# Patient Record
Sex: Female | Born: 1940 | Race: White | Hispanic: No | Marital: Married | Smoking: Never smoker
Health system: Southern US, Community
[De-identification: ages and names within clinical notes are randomized; demographics above are authoritative.]

## PROBLEM LIST (undated history)

## (undated) DIAGNOSIS — I493 Ventricular premature depolarization: Secondary | ICD-10-CM

## (undated) DIAGNOSIS — R51 Headache: Secondary | ICD-10-CM

## (undated) DIAGNOSIS — E785 Hyperlipidemia, unspecified: Secondary | ICD-10-CM

## (undated) DIAGNOSIS — I77810 Thoracic aortic ectasia: Secondary | ICD-10-CM

## (undated) DIAGNOSIS — Z973 Presence of spectacles and contact lenses: Secondary | ICD-10-CM

## (undated) DIAGNOSIS — M7989 Other specified soft tissue disorders: Secondary | ICD-10-CM

## (undated) DIAGNOSIS — I2510 Atherosclerotic heart disease of native coronary artery without angina pectoris: Secondary | ICD-10-CM

## (undated) DIAGNOSIS — Z974 Presence of external hearing-aid: Secondary | ICD-10-CM

## (undated) DIAGNOSIS — R519 Headache, unspecified: Secondary | ICD-10-CM

## (undated) DIAGNOSIS — Z9689 Presence of other specified functional implants: Secondary | ICD-10-CM

## (undated) DIAGNOSIS — M1990 Unspecified osteoarthritis, unspecified site: Secondary | ICD-10-CM

## (undated) HISTORY — DX: Headache, unspecified: R51.9

## (undated) HISTORY — PX: INNER EAR SURGERY: SHX679

## (undated) HISTORY — DX: Atherosclerotic heart disease of native coronary artery without angina pectoris: I25.10

## (undated) HISTORY — DX: Presence of spectacles and contact lenses: Z97.3

## (undated) HISTORY — PX: OTHER SURGICAL HISTORY: SHX169

## (undated) HISTORY — DX: Presence of other specified functional implants: Z96.89

## (undated) HISTORY — DX: Presence of external hearing-aid: Z97.4

## (undated) HISTORY — DX: Ventricular premature depolarization: I49.3

## (undated) HISTORY — DX: Thoracic aortic ectasia: I77.810

## (undated) HISTORY — DX: Other specified soft tissue disorders: M79.89

## (undated) HISTORY — DX: Unspecified osteoarthritis, unspecified site: M19.90

## (undated) HISTORY — PX: CATARACT EXTRACTION, BILATERAL: SHX1313

## (undated) HISTORY — PX: COSMETIC SURGERY: SHX468

## (undated) HISTORY — DX: Hyperlipidemia, unspecified: E78.5

## (undated) HISTORY — PX: KNEE SURGERY: SHX244

## (undated) HISTORY — DX: Headache: R51

---

## 1967-06-11 HISTORY — PX: TUBAL LIGATION: SHX77

## 1989-06-10 HISTORY — PX: CYSTECTOMY: SUR359

## 1998-02-09 ENCOUNTER — Inpatient Hospital Stay (HOSPITAL_COMMUNITY): Admission: EM | Admit: 1998-02-09 | Discharge: 1998-02-12 | Payer: Self-pay | Admitting: Emergency Medicine

## 1998-02-09 ENCOUNTER — Encounter: Payer: Self-pay | Admitting: Emergency Medicine

## 1998-02-10 ENCOUNTER — Encounter: Payer: Self-pay | Admitting: Internal Medicine

## 1998-02-10 ENCOUNTER — Encounter: Payer: Self-pay | Admitting: General Surgery

## 1998-06-06 ENCOUNTER — Encounter: Payer: Self-pay | Admitting: Internal Medicine

## 1998-06-06 ENCOUNTER — Ambulatory Visit (HOSPITAL_COMMUNITY): Admission: RE | Admit: 1998-06-06 | Discharge: 1998-06-06 | Payer: Self-pay | Admitting: Internal Medicine

## 1998-07-12 ENCOUNTER — Other Ambulatory Visit: Admission: RE | Admit: 1998-07-12 | Discharge: 1998-07-12 | Payer: Self-pay | Admitting: Obstetrics and Gynecology

## 1998-09-07 ENCOUNTER — Ambulatory Visit (HOSPITAL_COMMUNITY): Admission: RE | Admit: 1998-09-07 | Discharge: 1998-09-07 | Payer: Self-pay | Admitting: Obstetrics and Gynecology

## 1998-12-22 ENCOUNTER — Ambulatory Visit (HOSPITAL_COMMUNITY): Admission: RE | Admit: 1998-12-22 | Discharge: 1998-12-22 | Payer: Self-pay | Admitting: Obstetrics & Gynecology

## 1999-06-11 HISTORY — PX: BUNIONECTOMY: SHX129

## 1999-06-14 ENCOUNTER — Ambulatory Visit (HOSPITAL_COMMUNITY): Admission: RE | Admit: 1999-06-14 | Discharge: 1999-06-14 | Payer: Self-pay | Admitting: Internal Medicine

## 1999-09-10 ENCOUNTER — Other Ambulatory Visit: Admission: RE | Admit: 1999-09-10 | Discharge: 1999-09-10 | Payer: Self-pay | Admitting: Obstetrics and Gynecology

## 2000-03-19 ENCOUNTER — Other Ambulatory Visit: Admission: RE | Admit: 2000-03-19 | Discharge: 2000-03-19 | Payer: Self-pay | Admitting: Obstetrics & Gynecology

## 2000-09-02 ENCOUNTER — Ambulatory Visit (HOSPITAL_COMMUNITY): Admission: RE | Admit: 2000-09-02 | Discharge: 2000-09-02 | Payer: Self-pay | Admitting: Obstetrics & Gynecology

## 2000-09-02 ENCOUNTER — Encounter: Payer: Self-pay | Admitting: Obstetrics & Gynecology

## 2001-03-30 ENCOUNTER — Emergency Department (HOSPITAL_COMMUNITY): Admission: EM | Admit: 2001-03-30 | Discharge: 2001-03-30 | Payer: Self-pay | Admitting: Emergency Medicine

## 2001-03-30 ENCOUNTER — Encounter: Payer: Self-pay | Admitting: Emergency Medicine

## 2001-11-25 ENCOUNTER — Encounter: Payer: Self-pay | Admitting: Family Medicine

## 2001-11-25 ENCOUNTER — Ambulatory Visit (HOSPITAL_COMMUNITY): Admission: RE | Admit: 2001-11-25 | Discharge: 2001-11-25 | Payer: Self-pay | Admitting: Family Medicine

## 2002-04-27 ENCOUNTER — Ambulatory Visit (HOSPITAL_COMMUNITY): Admission: RE | Admit: 2002-04-27 | Discharge: 2002-04-27 | Payer: Self-pay | Admitting: Otolaryngology

## 2002-04-27 ENCOUNTER — Encounter: Payer: Self-pay | Admitting: Otolaryngology

## 2002-11-15 ENCOUNTER — Ambulatory Visit (HOSPITAL_COMMUNITY): Admission: RE | Admit: 2002-11-15 | Discharge: 2002-11-15 | Payer: Self-pay | Admitting: Gastroenterology

## 2003-04-08 ENCOUNTER — Encounter: Admission: RE | Admit: 2003-04-08 | Discharge: 2003-04-08 | Payer: Self-pay

## 2004-04-30 ENCOUNTER — Encounter: Admission: RE | Admit: 2004-04-30 | Discharge: 2004-04-30 | Payer: Self-pay | Admitting: Family Medicine

## 2004-07-25 ENCOUNTER — Encounter: Admission: RE | Admit: 2004-07-25 | Discharge: 2004-07-25 | Payer: Self-pay | Admitting: Family Medicine

## 2005-06-19 ENCOUNTER — Encounter: Admission: RE | Admit: 2005-06-19 | Discharge: 2005-06-19 | Payer: Self-pay | Admitting: Family Medicine

## 2005-10-09 ENCOUNTER — Encounter: Payer: Self-pay | Admitting: General Surgery

## 2006-04-16 ENCOUNTER — Encounter: Admission: RE | Admit: 2006-04-16 | Discharge: 2006-04-16 | Payer: Self-pay | Admitting: Orthopedic Surgery

## 2006-06-27 ENCOUNTER — Encounter: Admission: RE | Admit: 2006-06-27 | Discharge: 2006-06-27 | Payer: Self-pay | Admitting: Family Medicine

## 2006-07-29 ENCOUNTER — Encounter: Admission: RE | Admit: 2006-07-29 | Discharge: 2006-07-29 | Payer: Self-pay | Admitting: Sports Medicine

## 2007-07-28 ENCOUNTER — Encounter: Admission: RE | Admit: 2007-07-28 | Discharge: 2007-07-28 | Payer: Self-pay | Admitting: Family Medicine

## 2008-07-28 ENCOUNTER — Encounter: Admission: RE | Admit: 2008-07-28 | Discharge: 2008-07-28 | Payer: Self-pay | Admitting: Family Medicine

## 2009-06-10 HISTORY — PX: OTHER SURGICAL HISTORY: SHX169

## 2009-08-03 ENCOUNTER — Encounter: Admission: RE | Admit: 2009-08-03 | Discharge: 2009-08-03 | Payer: Self-pay | Admitting: Family Medicine

## 2009-10-07 ENCOUNTER — Encounter: Admission: RE | Admit: 2009-10-07 | Discharge: 2009-10-07 | Payer: Self-pay | Admitting: Neurology

## 2010-07-01 ENCOUNTER — Encounter: Payer: Self-pay | Admitting: Family Medicine

## 2010-10-24 ENCOUNTER — Other Ambulatory Visit: Payer: Self-pay | Admitting: Obstetrics & Gynecology

## 2010-10-24 DIAGNOSIS — Z1231 Encounter for screening mammogram for malignant neoplasm of breast: Secondary | ICD-10-CM

## 2010-10-26 NOTE — Op Note (Signed)
   NAMETARYN, Heidi Tucker                         ACCOUNT NO.:  [id number]   MEDICAL RECORD NO.:  [id number]                   PATIENT TYPE:  AMB   LOCATION:  ENDO                                 FACILITY:  [facility]   PHYSICIAN:  Anselmo Rod, M.D.               DATE OF BIRTH:  [birth year]   DATE OF PROCEDURE:  11/15/2002  DATE OF DISCHARGE:                                 OPERATIVE REPORT   PROCEDURE:  Screening colonoscopy.   ENDOSCOPIST:  Anselmo Rod, M.D.   INSTRUMENT USED:  Olympus video colonoscope.   INDICATIONS FOR PROCEDURE:  A 70 year old female undergoing screening  colonoscopy to rule out colonic polyps, masses, etc.   PREPROCEDURE PREPARATION:  Informed consent was procured from the patient.  The patient fasted for eight hours prior to the procedure and prepped with a  bottle of magnesium citrate and a gallon of GoLYTELY the night prior to the  procedure.   PREPROCEDURE PHYSICAL:  The patient had stable vital signs. Neck supple.  Chest clear to auscultation. S1, S2 regular. Abdomen soft with normal bowel  sounds.   DESCRIPTION OF PROCEDURE:  The patient was placed in the left lateral  decubitus position and sedated with 50 mg of Demerol and 5 mg of Versed  intravenously. Once the patient was adequately sedated and maintained on low  flow oxygen and continuous cardiac monitoring, the Olympus video colonoscope  was advanced from the rectum to the cecum without difficulty except for  small internal hemorrhoids not seen on retroflexion. No erosions,  ulcerations, masses, or diverticula were noted. The appendiceal orifice and  the ileocecal valve were visualized and photographed.   IMPRESSION:  Normal colonoscopy except for small internal hemorrhoids.   RECOMMENDATIONS:  1. A high fiber diet with liberal fluid intake has been added.  2.     Outpatient followup on a p.r.n. basis.  3. Repeat colorectal cancer screening in the next 10 years unless the     patient  develops any abnormal symptoms in the interim.                                               Anselmo Rod, M.D.    JNM/MEDQ  D:  11/15/2002  T:  11/15/2002  Job:  [id number]   cc:   Tammy R. Collins [place], M.D.  [address]  [place]  [place] [address]  Fax: [phone]

## 2010-11-23 ENCOUNTER — Ambulatory Visit: Payer: Self-pay

## 2010-12-10 ENCOUNTER — Ambulatory Visit: Payer: Self-pay

## 2011-02-13 ENCOUNTER — Ambulatory Visit
Admission: RE | Admit: 2011-02-13 | Discharge: 2011-02-13 | Disposition: A | Discharge status: Home / Self Care | Payer: Medicare Other | Source: Ambulatory Visit | Attending: Obstetrics & Gynecology | Admitting: Obstetrics & Gynecology

## 2011-02-13 DIAGNOSIS — Z1231 Encounter for screening mammogram for malignant neoplasm of breast: Secondary | ICD-10-CM

## 2011-02-18 ENCOUNTER — Encounter (INDEPENDENT_AMBULATORY_CARE_PROVIDER_SITE_OTHER): Payer: Self-pay | Admitting: General Surgery

## 2011-02-27 ENCOUNTER — Ambulatory Visit (INDEPENDENT_AMBULATORY_CARE_PROVIDER_SITE_OTHER): Payer: Medicare Other | Admitting: General Surgery

## 2011-02-27 ENCOUNTER — Encounter (INDEPENDENT_AMBULATORY_CARE_PROVIDER_SITE_OTHER): Payer: Self-pay | Admitting: General Surgery

## 2011-02-27 VITALS — BP 114/70 | HR 52 | Temp 97.8°F | Resp 16 | Ht 63.5 in | Wt 166.2 lb

## 2011-02-27 DIAGNOSIS — D216 Benign neoplasm of connective and other soft tissue of trunk, unspecified: Secondary | ICD-10-CM

## 2011-02-27 DIAGNOSIS — D367 Benign neoplasm of other specified sites: Secondary | ICD-10-CM

## 2011-02-27 NOTE — Progress Notes (Signed)
Heidi Tucker is an established patient with a known deep soft tissue mass in the right lower back that is palpable in the sitting position but not the prone position.  She states that the mass is getting larger and starting to become more uncomfortable.  She is here to discuss possible removal.    PE:  General- WDWN, NAD.  Back-  3 cm soft tissue mass in the lower back palpable when she is sitting up and leaning forward; it is not palpable in the prone position.  Assessment:  Enlarging and increasingly symptomatic right lower back mass.  She is interested in having it removed.    Plan:  Removal of the soft tissue mass in the back with local anesthesia.  She will need to be in the sitting position leaning forward.  The procedure and risks ( including but not limited to bleeding, infection, wound problems, recurrence, reaction to local anesthesia) were discussed with her.  She seems to understand and would like to proceed.

## 2011-03-05 DIAGNOSIS — D1739 Benign lipomatous neoplasm of skin and subcutaneous tissue of other sites: Secondary | ICD-10-CM

## 2011-03-26 ENCOUNTER — Encounter (INDEPENDENT_AMBULATORY_CARE_PROVIDER_SITE_OTHER): Payer: Self-pay | Admitting: General Surgery

## 2011-03-27 ENCOUNTER — Ambulatory Visit (INDEPENDENT_AMBULATORY_CARE_PROVIDER_SITE_OTHER): Payer: Medicare Other | Admitting: General Surgery

## 2011-03-27 ENCOUNTER — Encounter (INDEPENDENT_AMBULATORY_CARE_PROVIDER_SITE_OTHER): Payer: Self-pay | Admitting: General Surgery

## 2011-03-27 VITALS — BP 114/78 | HR 64 | Temp 97.2°F | Resp 16 | Ht 63.0 in | Wt 168.0 lb

## 2011-03-27 DIAGNOSIS — D171 Benign lipomatous neoplasm of skin and subcutaneous tissue of trunk: Secondary | ICD-10-CM

## 2011-03-27 DIAGNOSIS — D1779 Benign lipomatous neoplasm of other sites: Secondary | ICD-10-CM

## 2011-03-27 NOTE — Progress Notes (Signed)
Operation:  Removal of left lower back lipoma  Date:  03/05/11  Pathology:  Benign  HPI:  Heidi Tucker is here for her first postop visit.  She is doing well. She was very active over the weekend and had some discomfort in her left back area.  PE:  The left back incision is clean, dry and intact.  Assessment:   Wound healing well and path. is benign.  This was discussed with her.  If she has persistent back pain, it is very likely musculoskeletal in origin.  Plan:  May try Mederma on scar.  Return visit prn.

## 2011-03-27 NOTE — Patient Instructions (Signed)
You may try Mederma (OTC) on your scar.

## 2011-04-03 ENCOUNTER — Encounter (INDEPENDENT_AMBULATORY_CARE_PROVIDER_SITE_OTHER): Payer: Self-pay | Admitting: General Surgery

## 2011-10-02 DIAGNOSIS — G250 Essential tremor: Secondary | ICD-10-CM | POA: Insufficient documentation

## 2011-11-25 ENCOUNTER — Encounter: Payer: Medicare Other | Attending: Obstetrics & Gynecology | Admitting: *Deleted

## 2011-11-25 DIAGNOSIS — E119 Type 2 diabetes mellitus without complications: Secondary | ICD-10-CM | POA: Insufficient documentation

## 2011-11-25 DIAGNOSIS — Z713 Dietary counseling and surveillance: Secondary | ICD-10-CM | POA: Insufficient documentation

## 2011-11-26 ENCOUNTER — Encounter: Payer: Self-pay | Admitting: *Deleted

## 2011-11-26 NOTE — Patient Instructions (Signed)
Goals:  Follow Diabetes Meal Plan as instructed  Eat 3 meals and 2 snacks, every 3-5 hrs  Limit carbohydrate intake to 30-45 grams carbohydrate/meal  Limit carbohydrate intake to 15 grams carbohydrate/snack  Add lean protein foods to meals/snacks  Monitor glucose levels as instructed by your doctor  Aim for 30 mins of physical activity daily  Bring food record and glucose log to your next nutrition visit 

## 2011-11-26 NOTE — Progress Notes (Signed)
HgA1C: 6.6% Patient was seen on 11/25/11 for the first of a series of three diabetes self-management courses at the [facility]. The following learning objectives were met by the patient during this course:   Defines the role of glucose and insulin  Identifies type of diabetes and pathophysiology  Defines the diagnostic criteria for diabetes and prediabetes  States the risk factors for Type 2 Diabetes  States the symptoms of Type 2 Diabetes  Defines Type 2 Diabetes treatment goals  Defines Type 2 Diabetes treatment options  States the rationale for glucose monitoring  Identifies A1C, glucose targets, and testing times  Identifies proper sharps disposal  Defines the purpose of a diabetes food plan  Identifies carbohydrate food groups  Defines effects of carbohydrate foods on glucose levels  Identifies carbohydrate choices/grams/food labels  States benefits of physical activity and effect on glucose  Review of suggested activity guidelines  Handouts given during class include:  Type 2 Diabetes: Basics Book  My Food Plan Book  Food and Activity Log  Patient has established the following initial goals:  Follow Diabetes Meal Plan as instructed  Eat 3 meals and 2 snacks, every 3-5 hrs  Limit carbohydrate intake to 30-45 grams carbohydrate/meal  Limit carbohydrate intake to 15 grams carbohydrate/snack  Add lean protein foods to meals/snacks  Monitor glucose levels as instructed by your doctor  Aim for 30 mins of physical activity daily  Bring food record and glucose log to your next nutrition visit  Follow-Up Plan: Attend Core 2 and Core 3 classes

## 2012-01-14 ENCOUNTER — Encounter: Payer: Medicare Other | Attending: Obstetrics & Gynecology | Admitting: *Deleted

## 2012-01-14 DIAGNOSIS — E119 Type 2 diabetes mellitus without complications: Secondary | ICD-10-CM | POA: Insufficient documentation

## 2012-01-14 DIAGNOSIS — Z713 Dietary counseling and surveillance: Secondary | ICD-10-CM | POA: Insufficient documentation

## 2012-01-14 NOTE — Progress Notes (Signed)
  Patient was seen on 01/14/12 for the second of a series of three diabetes self-management courses at the [facility]. The following learning objectives were met by the patient during this course:   Explain basic nutrition maintenance and quality assurance  Describe causes, symptoms and treatment of hypoglycemia and hyperglycemia  Explain how to manage diabetes during illness  Describe the importance of good nutrition for health and healthy eating strategies  List strategies to follow meal plan when dining out  Describe the effects of alcohol on glucose and how to use it safely  Describe problem solving skills for day-to-day glucose challenges  Describe strategies to use when treatment plan needs to change  Identify important factors involved in successful weight loss  Describe ways to remain physically active  Describe the impact of regular activity on insulin resistance   Handouts given in class:  Tips for weight loss  [facility] Oral medication/insulin handout  Follow-Up Plan: Patient will attend the final class of the ADA Diabetes Self-Care Education.   

## 2012-01-28 ENCOUNTER — Encounter: Payer: Medicare Other | Admitting: Dietician

## 2012-01-28 DIAGNOSIS — E119 Type 2 diabetes mellitus without complications: Secondary | ICD-10-CM

## 2012-01-29 NOTE — Progress Notes (Signed)
  Patient was seen on 01/28/2012 for the third of a series of three diabetes self-management courses at the [facility]. The following learning objectives were met by the patient during this course:    Describe how diabetes changes over time   Identify diabetes complications and ways to prevent them   Describe strategies that can promote heart health including lowering blood pressure and cholesterol   Describe strategies to lower dietary fat and sodium in the diet   Identify physical activities that benefit cardiovascular health   Evaluate success in meeting personal goal   Describe the belief that they can live successfully with diabetes day to day   Establish 2-3 goals that they will plan to diligently work on until they return for the free 57-month follow-up visit  The following handouts were given in class:  3 Month Follow Up Visit handout  Goal setting handout  Class evaluation form  Your patient has established the following 3 month goals for diabetes self-care:  I am active, 50 minutes of treadmill daily.  I will not take medication , I control with diet at 6.1.  Never smoke.  Look for patterns in my glucose book 10 days a month.  Test my glucose 1-2 days a week.  Get MD for prescription for glucose meter and learn how to use it.  Follow-Up Plan: Patient will attend a 3 month follow-up visit for diabetes self-management education.  Note:  On 01/29/2012 Contacted by Ms. Szafran, she had contacted her MD and she could get a meter and the MD would call in the prescription for strips and lancets.  She came to my office and I provided an Accu-Chek Smartview meter kit  Lot: [id number] EXP:  03/09/2013 and oriented her to it's use.  On return demonstration, her blood glucose prior to lunch was 107.

## 2012-04-22 ENCOUNTER — Encounter: Payer: Medicare Other | Attending: Obstetrics & Gynecology | Admitting: Dietician

## 2012-04-22 DIAGNOSIS — E119 Type 2 diabetes mellitus without complications: Secondary | ICD-10-CM | POA: Insufficient documentation

## 2012-04-22 DIAGNOSIS — Z713 Dietary counseling and surveillance: Secondary | ICD-10-CM | POA: Insufficient documentation

## 2012-04-22 NOTE — Progress Notes (Signed)
  Patient was seen on 04/22/2012 for their 3 month follow-up as a part of the diabetes self-management courses at the [facility]. The following learning objectives were met by your patient during this course:  Patient self reports the following: Yes Diabetes control has improved since diabetes self-management training: She feels that it has Number of days blood glucose is >200: None Last MD appointment for diabetes: Will be setting up an appointment with her primary that is helping her to manage her diabetes.   Changes in treatment plan: None at this time.  She is monitoring her blood glucose every 4-5 days fasting.  If the the level is in the 100-110 mg range she will monitor within the next 4-5 days.  If the reading is in the 120 or greater range, she will monitor the next day and continue until she is back at the 100-110 mg range Confidence with ability to manage diabetes: Notes she feels more confident. Areas for improvement with diabetes self-care: Wants to lose some more weight.  Her goal is 135 lbs. Willingness to participate in diabetes support group: Currently, her schedule and commute time limits evening travel.  Please see Diabetes Flow sheet for findings related to patient's self-care.  Follow-Up Plan: Patient is eligible for a "free" 30 minute diabetes self-care appointment in the next year. Patient to call and schedule as needed.

## 2013-11-05 ENCOUNTER — Other Ambulatory Visit: Payer: Self-pay

## 2013-11-05 DIAGNOSIS — Z1231 Encounter for screening mammogram for malignant neoplasm of breast: Secondary | ICD-10-CM

## 2013-11-30 ENCOUNTER — Ambulatory Visit
Admission: RE | Admit: 2013-11-30 | Discharge: 2013-11-30 | Disposition: A | Discharge status: Home / Self Care | Payer: Medicare Other | Source: Ambulatory Visit

## 2013-11-30 ENCOUNTER — Encounter (INDEPENDENT_AMBULATORY_CARE_PROVIDER_SITE_OTHER): Payer: Self-pay

## 2013-11-30 DIAGNOSIS — Z1231 Encounter for screening mammogram for malignant neoplasm of breast: Secondary | ICD-10-CM

## 2014-09-27 ENCOUNTER — Ambulatory Visit: Payer: Self-pay | Admitting: Cardiology

## 2014-10-06 ENCOUNTER — Ambulatory Visit: Payer: Self-pay | Admitting: Cardiology

## 2014-11-18 ENCOUNTER — Ambulatory Visit (INDEPENDENT_AMBULATORY_CARE_PROVIDER_SITE_OTHER): Payer: Medicare Other | Admitting: Cardiology

## 2014-11-18 ENCOUNTER — Encounter: Payer: Self-pay | Admitting: Cardiology

## 2014-11-18 VITALS — BP 104/58 | Ht 64.0 in | Wt 169.0 lb

## 2014-11-18 DIAGNOSIS — R0609 Other forms of dyspnea: Secondary | ICD-10-CM

## 2014-11-18 DIAGNOSIS — E785 Hyperlipidemia, unspecified: Secondary | ICD-10-CM

## 2014-11-18 DIAGNOSIS — I10 Essential (primary) hypertension: Secondary | ICD-10-CM

## 2014-11-18 DIAGNOSIS — R079 Chest pain, unspecified: Secondary | ICD-10-CM | POA: Diagnosis not present

## 2014-11-18 LAB — BASIC METABOLIC PANEL
BUN: 18 mg/dL (ref 6–23)
CO2: 28 mEq/L (ref 19–32)
Calcium: 9.2 mg/dL (ref 8.4–10.5)
Chloride: 103 mEq/L (ref 96–112)
Creatinine, Ser: 0.78 mg/dL (ref 0.40–1.20)
GFR: 76.63 mL/min (ref >60.00)
Glucose, Bld: 108 mg/dL — ABNORMAL HIGH (ref 70–99)
Potassium: 3.8 mEq/L (ref 3.5–5.1)
Sodium: 137 mEq/L (ref 135–145)

## 2014-11-18 LAB — CBC
HCT: 39.7 % (ref 36.0–46.0)
Hemoglobin: 13.2 g/dL (ref 12.0–15.0)
MCHC: 33.3 g/dL (ref 30.0–36.0)
MCV: 90.9 fl (ref 78.0–100.0)
Platelets: 183 10*3/uL (ref 150.0–400.0)
RBC: 4.37 Mil/uL (ref 3.87–5.11)
RDW: 13.7 % (ref 11.5–15.5)
WBC: 5.2 10*3/uL (ref 4.0–10.5)

## 2014-11-18 LAB — HEPATIC FUNCTION PANEL
ALT: 24 U/L (ref 0–35)
AST: 36 U/L (ref 0–37)
Albumin: 4.1 g/dL (ref 3.5–5.2)
Alkaline Phosphatase: 64 U/L (ref 39–117)
Bilirubin, Direct: 0.1 mg/dL (ref 0.0–0.3)
Total Bilirubin: 0.4 mg/dL (ref 0.2–1.2)
Total Protein: 6.4 g/dL (ref 6.0–8.3)

## 2014-11-18 LAB — TSH: TSH: 1.96 u[IU]/mL (ref 0.35–4.50)

## 2014-11-18 LAB — BRAIN NATRIURETIC PEPTIDE: Pro B Natriuretic peptide (BNP): 150 pg/mL — ABNORMAL HIGH (ref 0.0–100.0)

## 2014-11-18 NOTE — Progress Notes (Signed)
Patient ID: Heidi Tucker, female   DOB: 03/23/1941, 74 y.o.   MRN: [id number]      Cardiology Office Note  Date:  11/18/2014   ID:  Heidi Tucker, DOB August 23, 1940, MRN [id number]  PCP:  Heidi Seller, MD  Cardiologist:  Heidi Spark, MD   Chief complain: DOE, exertional chest pain   History of Present Illness: Heidi Tucker is a 74 y.o. female who presents for evaluation of exertional dyspnea and chest pain. 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 However, she has noticed significant DOE and chest dyscomfort in the last few months that got significantly worse in the last week. She gets SOB after walking 1 flight of stairs.  The patient was a longterm second hand smoker.  Her husband is also our patient (underwent CABG). She hasn't had cholesterol checked in a while but wishes not to take statin.  She denies orthopnea, PND, palpitations, syncope or claudications. Her father died of MI at age 48.   Past Medical History  Diagnosis Date  . Hearing loss   . Arthritis     hands  . Wears hearing aid   . Generalized headaches   . Wears glasses   . Hyperlipidemia   . Bilateral swelling of feet   . Diabetes mellitus     Past Surgical History  Procedure Laterality Date  . Tubal ligation  1969  . Inner ear surgery  [id number] and 1979  . Cystectomy  1991    back  . 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 Bunionectomy  2001  . Knee surgery    . Cosmetic surgery       Current Outpatient Prescriptions  Medication Sig Dispense Refill  . aspirin 81 MG tablet Take 81 mg by mouth daily.      . Multiple Vitamins-Minerals (CENTRUM SILVER PO) Take by mouth daily.      . Omega-3 Fatty Acids (FISH OIL) 1200 MG CAPS Take 3 capsules by mouth daily.      . primidone (MYSOLINE) 250 MG tablet daily.    . propranolol (INDERAL LA) 160 MG SR capsule as directed. Takes half in the am, half at noon, and 1/4 qhs    . SUMAtriptan  (IMITREX) 50 MG tablet      No current facility-administered medications for this visit.    Allergies:   Cephalexin; Doxycycline; and Naproxen   Social History:  The patient  reports that she has never smoked. She has never used smokeless tobacco. She reports that she does not drink alcohol or use illicit drugs.   Family History:  The patient's family history includes Heart disease in her father.   ROS:  Please see the history of present illness.   Otherwise, review of systems are positive for none.   All other systems are reviewed and negative.   PHYSICAL EXAM: VS:  BP 104/58 mmHg  Ht 5\' 4"  (1.626 m)  Wt 169 lb (76.658 kg)  BMI 28.99 kg/m2 , BMI Body mass index is 28.99 kg/(m^2). GEN: Well nourished, well developed, in no acute distress HEENT: normal Neck: no JVD, carotid bruits, or masses Cardiac: RRR; no murmurs, rubs, or gallops,no edema  Respiratory:  clear to auscultation bilaterally, normal work of breathing GI: soft, nontender, nondistended, + BS MS: no deformity or atrophy Skin: warm and dry, no rash Neuro:  Strength and sensation are intact Psych: euthymic mood, full affect  EKG:  EKG is ordered today. The ekg ordered today demonstrates SR, appears normal, however a lot of interference from brain transmitter for tremors   Recent Labs: No results found for requested labs within last 365 days.   Lipid Panel No results found for: CHOL, TRIG, HDL, CHOLHDL, VLDL, LDLCALC, LDLDIRECT    Wt Readings from Last 3 Encounters:  11/18/14 169 lb (76.658 kg)  04/22/12 158 lb 14.4 oz (72.077 kg)  11/26/11 163 lb 11.2 oz (74.254 kg)     ASSESSMENT AND PLAN:  74 year old   1. Typical exertional CP and SOB - we will schedule a Lexiscan stress test (ECG uninteretable sec to artifacts), we will also order echocardiogram to evaluate for systolic and diastolic function.  2, Hypertension - controlled  3. Hyperlipidemia - opposed to statins, opened to more exercise and diet  changes. We will check NMR lipids and LPa today. Also CBC, CMP, BNP and TSH (no labs in Epic).  Labs/ tests ordered today include:  Orders Placed This Encounter  Procedures  . Basic Metabolic Panel (BMET)  . CBC  . TSH  . Hepatic function panel  . B Nat Peptide  . NMR Lipoprofile with Lipids  . Lipoprotein A (LPA)  . Myocardial Perfusion Imaging  . ECHOCARDIOGRAM COMPLETE   Follow up in 2 months.  Signed, Heidi Spark, MD  11/18/2014 2:39 PM    Heidi Tucker, [place], [place]  [address] Phone: [phone]; Fax: [phone]

## 2014-11-18 NOTE — Patient Instructions (Signed)
Medication Instructions:  None  Labwork: BMET, TSH, CBC, LFT, BNP and NMR-LipoProtein A today  Testing/Procedures: Your physician has requested that you have an echocardiogram. Echocardiography is a painless test that uses sound waves to create images of your heart. It provides your doctor with information about the size and shape of your heart and how well your heart's chambers and valves are working. This procedure takes approximately one hour. There are no restrictions for this procedure.  Your physician has requested that you have a lexiscan myoview. For further information please visit [email or web address]. Please follow instruction sheet, as given.    Follow-Up: Your physician recommends that you schedule a follow-up appointment in: 2 months with Dr. Meda Coffee.    Any Other Special Instructions Will Be Listed Below (If Applicable).

## 2014-11-23 LAB — LIPOPROTEIN A (LPA): Lipoprotein (a): 22 mg/dL (ref 0–30)

## 2014-11-30 ENCOUNTER — Encounter: Payer: Self-pay | Admitting: Cardiology

## 2014-11-30 LAB — NMR LIPOPROFILE WITH LIPIDS

## 2014-12-05 ENCOUNTER — Telehealth (HOSPITAL_COMMUNITY): Payer: Self-pay | Admitting: *Deleted

## 2014-12-05 NOTE — Telephone Encounter (Signed)
Attempted to reach patient to give instruction for upcoming appointment on 12/07/14, but there was no answer. Hubbard Robinson, RN

## 2014-12-06 ENCOUNTER — Telehealth (HOSPITAL_COMMUNITY): Payer: Self-pay | Admitting: *Deleted

## 2014-12-06 NOTE — Telephone Encounter (Signed)
Patient given detailed instructions per Myocardial Perfusion Study Information Sheet for test on 12/07/14 at 0715. Patient Notified to arrive 15 minutes early, and that it is imperative to arrive on time for appointment to keep from having the test rescheduled. Patient verbalized understanding. Claudell Rhody, Ranae Palms

## 2014-12-07 ENCOUNTER — Other Ambulatory Visit: Payer: Self-pay

## 2014-12-07 ENCOUNTER — Ambulatory Visit (HOSPITAL_COMMUNITY): Payer: Medicare Other | Attending: Cardiovascular Disease

## 2014-12-07 ENCOUNTER — Ambulatory Visit (HOSPITAL_BASED_OUTPATIENT_CLINIC_OR_DEPARTMENT_OTHER): Payer: Medicare Other

## 2014-12-07 ENCOUNTER — Telehealth: Payer: Self-pay | Admitting: *Deleted

## 2014-12-07 DIAGNOSIS — R079 Chest pain, unspecified: Secondary | ICD-10-CM

## 2014-12-07 DIAGNOSIS — R9439 Abnormal result of other cardiovascular function study: Secondary | ICD-10-CM | POA: Diagnosis not present

## 2014-12-07 DIAGNOSIS — E119 Type 2 diabetes mellitus without complications: Secondary | ICD-10-CM | POA: Diagnosis not present

## 2014-12-07 DIAGNOSIS — R0609 Other forms of dyspnea: Secondary | ICD-10-CM | POA: Insufficient documentation

## 2014-12-07 DIAGNOSIS — E785 Hyperlipidemia, unspecified: Secondary | ICD-10-CM | POA: Diagnosis not present

## 2014-12-07 DIAGNOSIS — M1990 Unspecified osteoarthritis, unspecified site: Secondary | ICD-10-CM | POA: Diagnosis not present

## 2014-12-07 DIAGNOSIS — I10 Essential (primary) hypertension: Secondary | ICD-10-CM | POA: Insufficient documentation

## 2014-12-07 DIAGNOSIS — I5189 Other ill-defined heart diseases: Secondary | ICD-10-CM

## 2014-12-07 LAB — MYOCARDIAL PERFUSION IMAGING
Peak HR: 93 {beats}/min
RATE: 0.37
Rest HR: 55 {beats}/min
SDS: 3
SRS: 5
SSS: 8
TID: 1.06

## 2014-12-07 MED ORDER — TECHNETIUM TC 99M SESTAMIBI GENERIC - CARDIOLITE
10.6000 | Freq: Once | INTRAVENOUS | Status: AC | PRN
  Administered 2014-12-07: 11 via INTRAVENOUS

## 2014-12-07 MED ORDER — TECHNETIUM TC 99M SESTAMIBI GENERIC - CARDIOLITE
31.6000 | Freq: Once | INTRAVENOUS | Status: AC | PRN
  Administered 2014-12-07: 32 via INTRAVENOUS

## 2014-12-07 MED ORDER — REGADENOSON 0.4 MG/5ML IV SOLN
0.4000 mg | Freq: Once | INTRAVENOUS | Status: AC
  Administered 2014-12-07: 0.4 mg via INTRAVENOUS

## 2014-12-07 NOTE — Telephone Encounter (Signed)
-----   Message from Dorothy Spark, MD sent at 12/07/2014  4:30 PM EDT ----- Her study is non-conclusive, she needs to be scheduled for a coronary CTA with me in the week 7/18-22, if she wants it earlier, please schedule with other readers.

## 2014-12-07 NOTE — Telephone Encounter (Signed)
Contacted the pt to inform her that per Dr Meda Coffee [name] results were non-conclusive and she needs to be scheduled for a coronary CTA on the week of 12/26/14 -12/30/14 for Dr Meda Coffee to read.  Provided pt education on what a coronary CTA is and that this study will be done in the hospital with Dr Meda Coffee to read.  Informed the pt that someone from [facility] will be calling her to have this study scheduled in the hospital for Dr Meda Coffee to read.  Pt verbalized understanding, agrees with this plan, and is ok with waiting to have this done on the week of December 26, 2014 for Dr Meda Coffee to read.

## 2014-12-14 ENCOUNTER — Encounter: Payer: Self-pay | Admitting: Cardiology

## 2014-12-29 ENCOUNTER — Encounter (HOSPITAL_COMMUNITY): Payer: Self-pay

## 2014-12-29 ENCOUNTER — Ambulatory Visit (HOSPITAL_COMMUNITY)
Admission: RE | Admit: 2014-12-29 | Discharge: 2014-12-29 | Disposition: A | Discharge status: Home / Self Care | Payer: Medicare Other | Source: Ambulatory Visit | Attending: Cardiology | Admitting: Cardiology

## 2014-12-29 DIAGNOSIS — I519 Heart disease, unspecified: Secondary | ICD-10-CM | POA: Insufficient documentation

## 2014-12-29 DIAGNOSIS — R9439 Abnormal result of other cardiovascular function study: Secondary | ICD-10-CM | POA: Insufficient documentation

## 2014-12-29 DIAGNOSIS — I5189 Other ill-defined heart diseases: Secondary | ICD-10-CM

## 2014-12-29 MED ORDER — NITROGLYCERIN 0.4 MG SL SUBL
SUBLINGUAL_TABLET | SUBLINGUAL | Status: AC
  Administered 2014-12-29: 0.4 mg
  Filled 2014-12-29: qty 1

## 2014-12-29 MED ORDER — NITROGLYCERIN 0.4 MG SL SUBL: 0.4000 mg | SUBLINGUAL_TABLET | SUBLINGUAL | Status: DC | PRN

## 2014-12-29 MED ORDER — NITROGLYCERIN 0.4 MG SL SUBL
0.4000 mg | SUBLINGUAL_TABLET | Freq: Once | SUBLINGUAL | Status: DC
  Filled 2014-12-29: qty 25

## 2014-12-29 MED ORDER — NITROGLYCERIN 0.4 MG SL SUBL
0.4000 mg | SUBLINGUAL_TABLET | Freq: Once | SUBLINGUAL | Status: AC
  Administered 2014-12-29: 0.4 mg via SUBLINGUAL
  Filled 2014-12-29: qty 25

## 2014-12-29 MED ORDER — NITROGLYCERIN 0.4 MG SL SUBL
SUBLINGUAL_TABLET | SUBLINGUAL | Status: AC
  Filled 2014-12-29: qty 1

## 2014-12-29 NOTE — Progress Notes (Signed)
Technical problems in CT and unable to complete CTA of heart. Courtney CT tech, Roe Coombs RN,and Dorinda Hill in to explain and apologize that CT scanner went down. Patient is aware they will call her to reschedule. Patient states she understands. Discharged walking.

## 2015-01-10 ENCOUNTER — Telehealth: Payer: Self-pay | Admitting: *Deleted

## 2015-01-10 MED ORDER — EZETIMIBE 10 MG PO TABS: 10.0000 mg | ORAL_TABLET | Freq: Every day | ORAL | Status: DC

## 2015-01-10 NOTE — Telephone Encounter (Signed)
Pt brought her recent labs from [facility] (NMR lipoprofile, lipids) for Dr Meda Coffee to review and advise on. Informed the pt that Dr Meda Coffee did review her labs and based on her results, she recommends the pt start taking Zetia 10 mg po daily, and let our office know if she tolerates this well, and if its cost-effective with her insurance.  Pt requesting only a month supply to be sent to her pharmacy of choice to monitor her side effects and find out if she can afford this medicine or not.  Confirmed the pharmacy of choice with the pt.  Pt verbalized understanding and agrees with this plan.

## 2015-01-18 ENCOUNTER — Ambulatory Visit (INDEPENDENT_AMBULATORY_CARE_PROVIDER_SITE_OTHER): Payer: Medicare Other | Admitting: Cardiology

## 2015-01-18 ENCOUNTER — Encounter: Payer: Self-pay | Admitting: Cardiology

## 2015-01-18 VITALS — BP 110/68 | HR 58 | Ht 64.0 in | Wt 169.0 lb

## 2015-01-18 DIAGNOSIS — E785 Hyperlipidemia, unspecified: Secondary | ICD-10-CM

## 2015-01-18 DIAGNOSIS — R072 Precordial pain: Secondary | ICD-10-CM | POA: Diagnosis not present

## 2015-01-18 DIAGNOSIS — R079 Chest pain, unspecified: Secondary | ICD-10-CM | POA: Insufficient documentation

## 2015-01-18 DIAGNOSIS — I10 Essential (primary) hypertension: Secondary | ICD-10-CM

## 2015-01-18 NOTE — Patient Instructions (Signed)
Your physician recommends that you continue on your current medications as directed. Please refer to the Current Medication list given to you today.   Your physician wants you to follow-up in: 2 YEAR WITH DR NELSON You will receive a reminder letter in the mail two months in advance. If you don't receive a letter, please call our office to schedule the follow-up appointment.  

## 2015-01-18 NOTE — Progress Notes (Signed)
Patient ID: Heidi Tucker, female   DOB: [birth year], 74 y.o.   MRN: [id number]       Cardiology Office Note  Date:  01/18/2015   ID:  Heidi Tucker, DOB September 02, 1940, MRN [id number]  PCP:  Woody Seller, MD  Cardiologist:  Dorothy Spark, MD   Chief complain: DOE, exertional chest pain   History of Present Illness: Heidi Tucker is a 74 y.o. female who presents for evaluation of exertional dyspnea and chest pain. 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 However, she has noticed significant DOE and chest dyscomfort in the last few months that got significantly worse in the last week. She gets SOB after walking 1 flight of stairs.  The patient was a longterm second hand smoker.  Her husband is also our patient (underwent CABG). She hasn't had cholesterol checked in a while but wishes not to take statin.  She denies orthopnea, PND, palpitations, syncope or claudications. Her father died of MI at age 81. Mother lived to 62.  The patient is coming after 2 months, her symptoms of chest pain have resolved. No palpitations, DOE, she exercises on a treadmill 1 hour/6x per week, no symptoms.    Past Medical History  Diagnosis Date  . Hearing loss   . Arthritis     hands  . Wears hearing aid   . Generalized headaches   . Wears glasses   . Hyperlipidemia   . Bilateral swelling of feet   . Diabetes mellitus     Past Surgical History  Procedure Laterality Date  . Tubal ligation  1969  . Inner ear surgery  [id number] and 1979  . Cystectomy  1991    back  . 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 Bunionectomy  2001  . Knee surgery    . Cosmetic surgery       Current Outpatient Prescriptions  Medication Sig Dispense Refill  . aspirin 81 MG tablet Take 81 mg by mouth daily.      Marland Kitchen ezetimibe (ZETIA) 10 MG tablet Take 1 tablet (10 mg total) by mouth daily. 30 tablet 1  . Multiple Vitamin (MULTIVITAMIN) tablet Take 3  tablets by mouth daily. Metabolique Synergy    . Multiple Vitamins-Minerals (CENTRUM SILVER PO) Take 1 tablet by mouth daily.     . Omega-3 Fatty Acids (FISH OIL) 1200 MG CAPS Take 3 capsules by mouth daily.      . primidone (MYSOLINE) 250 MG tablet daily.    . propranolol (INDERAL LA) 160 MG SR capsule as directed. Takes half in the am, half at noon, and 1/4 qhs    . SUMAtriptan (IMITREX) 50 MG tablet      No current facility-administered medications for this visit.    Allergies:   Cephalexin; Doxycycline; and Naproxen   Social History:  The patient  reports that she has never smoked. She has never used smokeless tobacco. She reports that she does not drink alcohol or use illicit drugs.   Family History:  The patient's family history includes Heart disease in her father.   ROS:  Please see the history of present illness.   Otherwise, review of systems are positive for none.   All other systems are reviewed and negative.   PHYSICAL EXAM: VS:  BP 110/68 mmHg  Pulse 58  Ht 5\' 4"  (1.626 m)  Wt 169 lb (76.658 kg)  BMI 28.99 kg/m2  SpO2 98% , BMI Body mass index is 28.99 kg/(m^2). GEN: Well nourished, well developed, in no acute distress HEENT: normal Neck: no JVD, carotid bruits, or masses Cardiac: RRR; no murmurs, rubs, or gallops,no edema  Respiratory:  clear to auscultation bilaterally, normal work of breathing GI: soft, nontender, nondistended, + BS MS: no deformity or atrophy Skin: warm and dry, no rash Neuro:  Strength and sensation are intact Psych: euthymic mood, full affect  EKG:  EKG is ordered today. The ekg ordered today demonstrates SR, appears normal, however a lot of interference from brain transmitter for tremors   Recent Labs: 11/18/2014: ALT 24; BUN 18; Creatinine, Ser 0.78; Hemoglobin 13.2; Platelets 183.0; Potassium 3.8; Pro B Natriuretic peptide (BNP) 150.0*; Sodium 137; TSH 1.96   Lipid Panel    Component Value Date/Time   CHOL FAXED RESULT TO THE CLIENT_  11/18/2014 1447   TRIG FAXED RESULT TO THE CLIENT_ 11/18/2014 1447   HDL FAXED RESULT TO THE CLIENT_ 11/18/2014 1447   [place] FAXED RESULT TO THE CLIENT_ 11/18/2014 1447      Wt Readings from Last 3 Encounters:  01/18/15 169 lb (76.658 kg)  12/07/14 169 lb (76.658 kg)  11/18/14 169 lb (76.658 kg)   Coronary CTA: 12/29/2014 FINDINGS: Non-cardiac: See separate report from [facility].  Ascending Aorta: Normal caliber.  Pericardium: Normal  Coronary arteries: Normal origin. Left dominance.  IMPRESSION: Coronary calcium score of 0. This was 0 percentile for age and sex matched control.  Heidi Tucker  TTE: 12/07/2014 Left ventricle: The cavity size was normal. Systolic function was normal. The estimated ejection fraction was in the range of 55% to 60%. Wall motion was normal; there were no regional wall motion abnormalities. There was an increased relative contribution of atrial contraction to ventricular filling, which may be seen with aging. Doppler parameters are consistent with abnormal left ventricular relaxation (grade 1 diastolic dysfunction). - Atrial septum: No defect or patent foramen ovale was identified.     ASSESSMENT AND PLAN:  74 year old   1. Typical exertional CP and SOB - non-conclusive Lexiscan stress test (ECG uninteretable sec to artifacts), calcium score 0 and normal coronary CTA, no plaque. Echocardiogram showed normal systolic function and impaired relaxation. Normal BNP.  2. Hypertension - controlled  3. Hyperlipidemia - opposed to statins, opened to more exercise and diet changes. Considering her coronary CT is normal we will just continue zetia   Labs/ tests ordered today include:  No orders of the defined types were placed in this encounter.   Follow up in 2 year. Corliss Blacker, MD  01/18/2015 9:35 AM    [facility] [address] [place], [place], [place]   [address] Phone: [phone]; Fax: [phone]

## 2015-01-23 ENCOUNTER — Encounter: Payer: Self-pay | Admitting: Cardiology

## 2015-02-19 ENCOUNTER — Other Ambulatory Visit: Payer: Self-pay | Admitting: Cardiology

## 2015-07-10 DIAGNOSIS — M1990 Unspecified osteoarthritis, unspecified site: Secondary | ICD-10-CM | POA: Insufficient documentation

## 2015-07-10 DIAGNOSIS — G43909 Migraine, unspecified, not intractable, without status migrainosus: Secondary | ICD-10-CM | POA: Insufficient documentation

## 2015-08-07 DIAGNOSIS — R300 Dysuria: Secondary | ICD-10-CM | POA: Insufficient documentation

## 2015-12-05 ENCOUNTER — Other Ambulatory Visit: Payer: Self-pay

## 2015-12-05 MED ORDER — EZETIMIBE 10 MG PO TABS: 10.0000 mg | ORAL_TABLET | Freq: Every day | ORAL | Status: DC

## 2015-12-05 NOTE — Telephone Encounter (Signed)
Refill rqst received from patients pharmacy for 4 month supply of Zetia, [place]. Rx sent  for #[id number] R -0

## 2016-04-16 ENCOUNTER — Other Ambulatory Visit: Payer: Self-pay

## 2016-04-16 MED ORDER — EZETIMIBE 10 MG PO TABS: 10.0000 mg | ORAL_TABLET | Freq: Every day | ORAL | 2 refills | Status: DC

## 2016-04-27 ENCOUNTER — Other Ambulatory Visit: Payer: Self-pay | Admitting: Cardiology

## 2017-01-14 ENCOUNTER — Encounter: Payer: Self-pay | Admitting: Obstetrics & Gynecology

## 2017-01-14 ENCOUNTER — Ambulatory Visit (INDEPENDENT_AMBULATORY_CARE_PROVIDER_SITE_OTHER): Payer: Medicare Other | Admitting: Obstetrics & Gynecology

## 2017-01-14 VITALS — BP 120/84 | Ht 62.0 in | Wt 156.0 lb

## 2017-01-14 DIAGNOSIS — Z780 Asymptomatic menopausal state: Secondary | ICD-10-CM

## 2017-01-14 DIAGNOSIS — N393 Stress incontinence (female) (male): Secondary | ICD-10-CM

## 2017-01-14 DIAGNOSIS — Z01411 Encounter for gynecological examination (general) (routine) with abnormal findings: Secondary | ICD-10-CM | POA: Diagnosis not present

## 2017-01-14 NOTE — Progress Notes (Signed)
Heidi Tucker 10/31/1940 [id number]   History:    76 y.o. G2P2  Married.  From [place], speaks [place]  RP:  Established patient presenting for annual gyn exam   HPI:  Menopause.  No HRT.  No PMB.  No Pelvic pain.  Breasts wnl.  Urine normal except for moderate to severe SUI.  Happens with change in position, walking.  Worse when bladder is full.  BMs wnl.  Past medical history,surgical history, family history and social history were all reviewed and documented in the EPIC chart.  Gynecologic History No LMP recorded. Patient is postmenopausal. Contraception: post menopausal status Last Pap: 2015. Results were: normal Last mammogram: 2015. Results were: normal  Obstetric History OB History  Gravida Para Term Preterm AB Living  2 2       2   SAB TAB Ectopic Multiple Live Births               # Outcome Date GA Lbr Len/2nd Weight Sex Delivery Anes PTL Lv  2 Para           1 Para                ROS: A ROS was performed and pertinent positives and negatives are included in the history.  GENERAL: No fevers or chills. HEENT: No change in vision, no earache, sore throat or sinus congestion. NECK: No pain or stiffness. CARDIOVASCULAR: No chest pain or pressure. No palpitations. PULMONARY: No shortness of breath, cough or wheeze. GASTROINTESTINAL: No abdominal pain, nausea, vomiting or diarrhea, melena or bright red blood per rectum. GENITOURINARY: No urinary frequency, urgency, hesitancy or dysuria. MUSCULOSKELETAL: No joint or muscle pain, no back pain, no recent trauma. DERMATOLOGIC: No rash, no itching, no lesions. ENDOCRINE: No polyuria, polydipsia, no heat or cold intolerance. No recent change in weight. HEMATOLOGICAL: No anemia or easy bruising or bleeding. NEUROLOGIC: No headache, seizures, numbness, tingling or weakness. PSYCHIATRIC: No depression, no loss of interest in normal activity or change in sleep pattern.     Exam:   BP 120/84   Ht 5\' 2"  (1.575 m)   Wt 156 lb (70.8  kg)   BMI 28.53 kg/m   Body mass index is 28.53 kg/m.  General appearance : Well developed well nourished female. No acute distress HEENT: Eyes: no retinal hemorrhage or exudates,  Neck supple, trachea midline, no carotid bruits, no thyroidmegaly Lungs: Clear to auscultation, no rhonchi or wheezes, or rib retractions  Heart: Regular rate and rhythm, no murmurs or gallops Breast:Examined in sitting and supine position were symmetrical in appearance, no palpable masses or tenderness,  no skin retraction, no nipple inversion, no nipple discharge, no skin discoloration, no axillary or supraclavicular lymphadenopathy Abdomen: no palpable masses or tenderness, no rebound or guarding Extremities: no edema or skin discoloration or tenderness  Pelvic: Vulva: Normal  Bartholin, Urethra, Skene Glands: Within normal limits             Vagina: No gross lesions or discharge.  Cystocele grade 1/4.  Cervix: No gross lesions or discharge.  Pap reflex done.  Uterus AV, normal size, shape and consistency, non-tender and mobile  Adnexa  Without masses or tenderness  Anus and perineum  normal    Assessment/Plan:  76 y.o. female for annual exam   1. Encounter for gynecological examination with abnormal finding Normal Gyn exam with Menopausal atrophic vaginitis, asymptomatic.  Pap reflex done.  Breasts wnl.  Will schedule Screening Mammo.  2. Menopause present No HRT.  3. 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  Counseling on above issue >50% x 10 minutes.  Princess Bruins MD, 2:40 PM 01/14/2017

## 2017-01-15 LAB — PAP IG W/ RFLX HPV ASCU

## 2017-01-16 ENCOUNTER — Telehealth: Payer: Self-pay | Admitting: *Deleted

## 2017-01-16 NOTE — Telephone Encounter (Signed)
-----   Message from Princess Bruins, MD sent at 01/14/2017  3:22 PM EDT ----- Regarding: Refer to Urology, Dr. Matilde Sprang Stress Urinary Incontinence.  Wears a protective pad.  Overflow incontinence, SUI.  Possible Urgency as well.  Evaluation with Urodynamic testing and Physical Therapy.

## 2017-01-16 NOTE — Telephone Encounter (Signed)
Office notes faxed to [facility] they will contact pt to schedule and fax me back with time and date.

## 2017-01-18 ENCOUNTER — Other Ambulatory Visit: Payer: Self-pay | Admitting: Cardiology

## 2017-01-19 NOTE — Patient Instructions (Signed)
1. Encounter for gynecological examination with abnormal finding Normal Gyn exam with Menopausal atrophic vaginitis, asymptomatic.  Pap reflex done.  Breasts wnl.  Will schedule Screening Mammo.  2. Menopause present No HRT.  3. 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  Heidi Tucker, toujours un plaisir de te voir!  Je te communiquerai tes resultats aussitot que disponibles.   Kegel Exercises Kegel exercises help strengthen the muscles that support the rectum, vagina, small intestine, bladder, and uterus. Doing Kegel exercises can help:  Improve bladder and bowel control.  Improve sexual response.  Reduce problems and discomfort during pregnancy.  Kegel exercises involve squeezing your pelvic floor muscles, which are the same muscles you squeeze when you try to stop the flow of urine. The exercises can be done while sitting, standing, or lying down, but it is best to vary your position. Phase 1 exercises 1. Squeeze your pelvic floor muscles tight. You should feel a tight lift in your rectal area. If you are a female, you should also feel a tightness in your vaginal area. Keep your stomach, buttocks, and legs relaxed. 2. Hold the muscles tight for up to 10 seconds. 3. Relax your muscles. Repeat this exercise 50 times a day or as many times as told by your health care provider. Continue to do this exercise for at least 4-6 weeks or for as long as told by your health care provider. This information is not intended to replace advice given to you by your health care provider. Make sure you discuss any questions you have with your health care provider. Document Released: 05/13/2012 Document Revised: 01/20/2016 Document Reviewed: 04/16/2015 Elsevier Interactive Patient Education  [employer].

## 2017-01-23 ENCOUNTER — Other Ambulatory Visit: Payer: Self-pay | Admitting: Obstetrics & Gynecology

## 2017-01-23 DIAGNOSIS — Z1231 Encounter for screening mammogram for malignant neoplasm of breast: Secondary | ICD-10-CM

## 2017-01-27 NOTE — Telephone Encounter (Signed)
Pt scheduled on 03/19/17 @1 :30pm

## 2017-02-11 ENCOUNTER — Ambulatory Visit
Admission: RE | Admit: 2017-02-11 | Discharge: 2017-02-11 | Disposition: A | Discharge status: Home / Self Care | Payer: Medicare Other | Source: Ambulatory Visit | Attending: Obstetrics & Gynecology | Admitting: Obstetrics & Gynecology

## 2017-02-11 DIAGNOSIS — Z1231 Encounter for screening mammogram for malignant neoplasm of breast: Secondary | ICD-10-CM

## 2017-03-24 ENCOUNTER — Other Ambulatory Visit: Payer: Self-pay | Admitting: Cardiology

## 2017-04-04 ENCOUNTER — Other Ambulatory Visit: Payer: Self-pay | Admitting: Cardiology

## 2017-04-15 ENCOUNTER — Other Ambulatory Visit: Payer: Self-pay | Admitting: Cardiology

## 2017-07-08 DIAGNOSIS — M25561 Pain in right knee: Secondary | ICD-10-CM | POA: Insufficient documentation

## 2017-07-08 DIAGNOSIS — M179 Osteoarthritis of knee, unspecified: Secondary | ICD-10-CM | POA: Insufficient documentation

## 2017-07-28 DIAGNOSIS — M546 Pain in thoracic spine: Secondary | ICD-10-CM | POA: Insufficient documentation

## 2017-10-31 ENCOUNTER — Ambulatory Visit: Payer: Medicare Other | Admitting: Cardiology

## 2017-10-31 ENCOUNTER — Encounter: Payer: Self-pay | Admitting: Cardiology

## 2017-10-31 ENCOUNTER — Ambulatory Visit (HOSPITAL_COMMUNITY)
Admission: RE | Admit: 2017-10-31 | Discharge: 2017-10-31 | Disposition: A | Discharge status: Home / Self Care | Payer: Medicare Other | Source: Ambulatory Visit | Attending: Cardiology | Admitting: Cardiology

## 2017-10-31 ENCOUNTER — Ambulatory Visit (HOSPITAL_COMMUNITY): Admission: RE | Admit: 2017-10-31 | Payer: Medicare Other | Source: Ambulatory Visit

## 2017-10-31 VITALS — BP 100/70 | HR 68 | Ht 62.0 in | Wt 163.0 lb

## 2017-10-31 DIAGNOSIS — R7989 Other specified abnormal findings of blood chemistry: Secondary | ICD-10-CM | POA: Diagnosis not present

## 2017-10-31 DIAGNOSIS — I2510 Atherosclerotic heart disease of native coronary artery without angina pectoris: Secondary | ICD-10-CM

## 2017-10-31 DIAGNOSIS — I10 Essential (primary) hypertension: Secondary | ICD-10-CM | POA: Insufficient documentation

## 2017-10-31 DIAGNOSIS — R002 Palpitations: Secondary | ICD-10-CM

## 2017-10-31 DIAGNOSIS — R0609 Other forms of dyspnea: Secondary | ICD-10-CM | POA: Insufficient documentation

## 2017-10-31 DIAGNOSIS — I288 Other diseases of pulmonary vessels: Secondary | ICD-10-CM | POA: Insufficient documentation

## 2017-10-31 DIAGNOSIS — R079 Chest pain, unspecified: Secondary | ICD-10-CM | POA: Insufficient documentation

## 2017-10-31 DIAGNOSIS — R799 Abnormal finding of blood chemistry, unspecified: Secondary | ICD-10-CM

## 2017-10-31 DIAGNOSIS — E785 Hyperlipidemia, unspecified: Secondary | ICD-10-CM

## 2017-10-31 LAB — BASIC METABOLIC PANEL
BUN/Creatinine Ratio: 27 (ref 12–28)
BUN: 20 mg/dL (ref 8–27)
CO2: 21 mmol/L (ref 20–29)
Calcium: 9.1 mg/dL (ref 8.7–10.3)
Chloride: 105 mmol/L (ref 96–106)
Creatinine, Ser: 0.75 mg/dL (ref 0.57–1.00)
GFR calc Af Amer: 89 mL/min/{1.73_m2} (ref >59)
GFR calc non Af Amer: 77 mL/min/{1.73_m2} (ref >59)
Glucose: 105 mg/dL — ABNORMAL HIGH (ref 65–99)
Potassium: 4.4 mmol/L (ref 3.5–5.2)
Sodium: 142 mmol/L (ref 134–144)

## 2017-10-31 LAB — CBC WITH DIFFERENTIAL/PLATELET
Basophils Absolute: 0 10*3/uL (ref 0.0–0.2)
Basos: 0 %
EOS (ABSOLUTE): 0.3 10*3/uL (ref 0.0–0.4)
Eos: 5 %
Hematocrit: 40.4 % (ref 34.0–46.6)
Hemoglobin: 14.3 g/dL (ref 11.1–15.9)
Immature Grans (Abs): 0 10*3/uL (ref 0.0–0.1)
Immature Granulocytes: 0 %
Lymphocytes Absolute: 1.4 10*3/uL (ref 0.7–3.1)
Lymphs: 28 %
MCH: 30.9 pg (ref 26.6–33.0)
MCHC: 35.4 g/dL (ref 31.5–35.7)
MCV: 87 fL (ref 79–97)
Monocytes Absolute: 0.5 10*3/uL (ref 0.1–0.9)
Monocytes: 10 %
Neutrophils Absolute: 2.7 10*3/uL (ref 1.4–7.0)
Neutrophils: 57 %
Platelets: 182 10*3/uL (ref 150–450)
RBC: 4.63 x10E6/uL (ref 3.77–5.28)
RDW: 13.8 % (ref 12.3–15.4)
WBC: 4.9 10*3/uL (ref 3.4–10.8)

## 2017-10-31 LAB — D-DIMER, QUANTITATIVE: D-DIMER: 0.52 mg/L FEU — ABNORMAL HIGH (ref 0.00–0.49)

## 2017-10-31 LAB — POCT I-STAT CREATININE: Creatinine, Ser: 0.8 mg/dL (ref 0.44–1.00)

## 2017-10-31 MED ORDER — NITROGLYCERIN 0.4 MG SL SUBL
0.8000 mg | SUBLINGUAL_TABLET | Freq: Once | SUBLINGUAL | Status: AC
  Administered 2017-10-31: 0.8 mg via SUBLINGUAL

## 2017-10-31 MED ORDER — NITROGLYCERIN 0.4 MG SL SUBL
SUBLINGUAL_TABLET | SUBLINGUAL | Status: AC
  Filled 2017-10-31: qty 2

## 2017-10-31 MED ORDER — IOPAMIDOL (ISOVUE-370) INJECTION 76%
INTRAVENOUS | Status: AC
  Administered 2017-10-31: 100 mL
  Filled 2017-10-31: qty 100

## 2017-10-31 NOTE — Progress Notes (Signed)
Cardiology Office Note:    Date:  November 29, 2017   ID:  Heidi Tucker, DOB 1940/11/28, MRN [id number]  PCP:  Heidi Sacramento, MD  Cardiologist: Heidi Dawley, MD  Referring MD: Heidi Sacramento, MD   Chief complaint: Dyspnea on exertion and chest pain  History of Present Illness:    Heidi Tucker is a 77 y.o. female who presented in 2016 for evaluation of exertional dyspnea and chest pain. 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 However, she has noticed significant DOE and chest dyscomfort in the last few months that got significantly worse in the last week. She gets SOB after walking 1 flight of stairs.  The patient was a longterm second hand smoker.  Her husband is also our patient (underwent CABG). She hasn't had cholesterol checked in a while but wishes not to take statin.  She denies orthopnea, PND, palpitations, syncope or claudications. Her father died of MI at age 29.  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  She has noticed recently that she has been more short of breath, and has mild chest pain after she comes out of the gym.  She goes to gym on a regular basis.  No dizziness, no syncope.  Past Medical History:  Diagnosis Date  . Arthritis    hands  . Bilateral swelling of feet   . Diabetes mellitus   . Generalized headaches   . Hearing loss   . Hyperlipidemia   . Wears glasses   . Wears hearing aid    Past Surgical History:  Procedure Laterality Date  . brain transmitter  2011   help to correct tremor  . BUNIONECTOMY  2001  . CATARACT EXTRACTION, BILATERAL    . COSMETIC SURGERY    . CYSTECTOMY  1991   back  . [place]    . KNEE SURGERY    . TUBAL LIGATION  1969    Current Medications: Current Meds  Medication Sig  . aspirin 81 MG tablet Take 81 mg by mouth daily.    Marland Kitchen ezetimibe (ZETIA) 10 MG tablet TAKE 1 TABLET BY MOUTH EVERY DAY  . Multiple Vitamins-Minerals (CENTRUM SILVER PO) Take 1 tablet by mouth daily.   . Omega-3 Fatty Acids (FISH OIL) 1200 MG CAPS Take 3 capsules by mouth daily.    . primidone (MYSOLINE) 250 MG tablet daily.  . propranolol (INDERAL LA) 160 MG SR capsule as directed. Takes half in the am, half at noon, and 1/4 qhs  . SUMAtriptan (IMITREX) 50 MG tablet      Allergies:   Cephalexin; Doxycycline; and Naproxen   Social History   Socioeconomic History  . Marital status: Married    Spouse name: Not on file  . Number of children: Not on file  . Years of education: Not on file  . Highest education level: Not on file  Occupational History  . Not on file  Social Needs  . Financial resource strain: Not on file  . Food insecurity:    Worry: Not on file    Inability: Not on file  . Transportation needs:    Medical: Not on file  Non-medical: Not on file  Tobacco Use  . Smoking status: Never Smoker  . Smokeless tobacco: Never Used  Substance and Sexual Activity  . Alcohol use: No  . Drug use: No  . Sexual activity: Yes    Partners: Male    Comment: 1ST INTERCOURSE- 4, PARTNERS- 2, MARRIED- 58 YRS   Lifestyle  . Physical activity:    Days per week: Not on file    Minutes per session: Not on file  . Stress: Not on file  Relationships  . Social connections:    Talks on phone: Not on file    Gets together: Not on file    Attends religious service: Not on file    Active member of club or organization: Not on file    Attends meetings of clubs or organizations: Not on file    Relationship status: Not on file  Other Topics Concern  . Not on file  Social History Narrative  . Not on file    Family History: The patient's family history includes Diabetes in her  father and maternal aunt; Heart disease in her father.  ROS:   Please see the history of present illness.    All other systems reviewed and are negative.  EKGs/Labs/Other Studies Reviewed:    The following studies were reviewed today:  EKG:  EKG is ordered today.  The ekg ordered today demonstrates SR, normal ECG, unchanged from prior, this was personally reviewed.  Recent Labs: No results found for requested labs within last 8760 hours.  Recent Lipid Panel    Component Value Date/Time   CHOL FAXED RESULT TO THE CLIENT_ 11/18/2014 1447   TRIG FAXED RESULT TO THE CLIENT_ 11/18/2014 1447   HDL FAXED RESULT TO THE CLIENT_ 11/18/2014 1447   [place] FAXED RESULT TO THE CLIENT_ 11/18/2014 1447    Physical Exam:    VS:  BP 100/70   Pulse 68   Ht 5\' 2"  (1.575 m)   Wt 163 lb (73.9 kg)   SpO2 97%   BMI 29.81 kg/m     Wt Readings from Last 3 Encounters:  10/31/17 163 lb (73.9 kg)  01/14/17 156 lb (70.8 kg)  01/18/15 169 lb (76.7 kg)     GEN: Well nourished, well developed in no acute distress HEENT: Normal NECK: No JVD; No carotid bruits LYMPHATICS: No lymphadenopathy CARDIAC: RRR, no murmurs, rubs, gallops RESPIRATORY:  Clear to auscultation without rales, wheezing or rhonchi  ABDOMEN: Soft, non-tender, non-distended MUSCULOSKELETAL:  No edema; No deformity  SKIN: Warm and dry NEUROLOGIC:  Alert and oriented x 3 PSYCHIATRIC:  Normal affect   Nuclear stress test: 11/2014 This is an equivocal study secondary to poor quality, there is perfusion defect in all apical and all inferior,inferolateral and anterolateral segments at rest. The perfusion defects improves in all apical anterolateral segments, bus persist in basal and mid inferior and inferolateral segments.  An alternative imaging modality such as coronary CT is recommended for further evaluation.   Coronary CTA: 12/2014 IMPRESSION: Coronary calcium score of 0. This was 0 percentile for age and sex matched  control. Heidi Tucker    ASSESSMENT:    1. Hyperlipidemia, unspecified hyperlipidemia type   2. Essential hypertension   3. DOE (dyspnea on exertion)   4. Chest pain, unspecified type   5. Palpitations    PLAN:    In order of problems listed above:  1. Typical postexertional CP and SOB -we will obtain coronary CTA, it was normal 3 years ago, we  are looking for progression of coronary artery disease as well as possible pulmonary embolism after a prolonged flight. We will obtain creatinine today as well as d-dimer.  2. Hypertension - controlled  3. Hyperlipidemia - opposed to statins, opened to more exercise and diet changes.  Followed by primary care physician.  4.  Palpitations -appears like SVT, however this is the first episode, if recurrence will obtain a Holter monitor.  Medication Adjustments/Labs and Tests Ordered: Current medicines are reviewed at length with the patient today.  Concerns regarding medicines are outlined above.  No orders of the defined types were placed in this encounter.  No orders of the defined types were placed in this encounter.   There are no Patient Instructions on file for this visit.   Signed, Heidi Dawley, MD  10/31/2017 8:44 AM    [facility]

## 2017-10-31 NOTE — Patient Instructions (Signed)
Medication Instructions:   Your physician recommends that you continue on your current medications as directed. Please refer to the Current Medication list given to you today.   Labwork:  TODAY--BMET, CBC W DIFF, AND D-DIMER    Testing/Procedures:  CORONARY CT TO BE SCHEDULED ASAP Please arrive at the [facility] main entrance of [facility] at xx:xx AM (30-45 minutes prior to test start time)  [facility] [place], [place] [address] [phone]  Proceed to the [facility] Radiology Department (First Floor).  Please follow these instructions carefully (unless otherwise directed):    On the Night Before the Test: . Drink plenty of water. . Do not consume any caffeinated/decaffeinated beverages or chocolate 12 hours prior to your test. . Do not take any antihistamines 12 hours prior to your test.   On the Day of the Test: . Drink plenty of water. Do not drink any water within one hour of the test. . Do not eat any food 4 hours prior to the test. . You may take your regular medications prior to the test.   After the Test: . Drink plenty of water. . After receiving IV contrast, you may experience a mild flushed feeling. This is normal. . On occasion, you may experience a mild rash up to 24 hours after the test. This is not dangerous. If this occurs, you can take Benadryl 25 mg and increase your fluid intake. . If you experience trouble breathing, this can be serious. If it is severe call 911 IMMEDIATELY. If it is mild, please call our office.    Follow-Up:  3 MONTHS WITH AN EXTENDER ON DR NELSON'S TEAM       If you need a refill on your cardiac medications before your next appointment, please call your pharmacy.

## 2017-10-31 NOTE — Addendum Note (Signed)
Addended by: Nuala Alpha on: 10/31/2017 11:23 AM   Modules accepted: Orders

## 2017-12-01 ENCOUNTER — Telehealth: Payer: Self-pay | Admitting: Cardiology

## 2017-12-03 ENCOUNTER — Encounter: Payer: Self-pay | Admitting: Cardiology

## 2017-12-03 ENCOUNTER — Ambulatory Visit: Payer: Medicare Other | Admitting: Cardiology

## 2017-12-03 VITALS — BP 96/64 | HR 62 | Ht 63.0 in | Wt 167.8 lb

## 2017-12-03 DIAGNOSIS — R002 Palpitations: Secondary | ICD-10-CM

## 2017-12-03 NOTE — Patient Instructions (Addendum)
Medication Instructions:   Your physician recommends that you continue on your current medications as directed. Please refer to the Current Medication list given to you today.    If you need a refill on your cardiac medications before your next appointment, please call your pharmacy.  Labwork: NONE ORDERED  TODAY    Testing/Procedures: Your physician has recommended that you wear an event monitor. Event monitors are medical devices that record the heart's electrical activity. Doctors most often [place] these monitors to diagnose arrhythmias. Arrhythmias are problems with the speed or rhythm of the heartbeat. The monitor is a small, portable device. You can wear one while you do your normal daily activities. This is usually used to diagnose what is causing palpitations/syncope (passing out).      Follow-Up:  BASED UPON RESULTS   Any Other Special Instructions Will Be Listed Below (If Applicable).

## 2017-12-03 NOTE — Progress Notes (Signed)
12/03/2017 Heidi Tucker   Oct 21, 1940  [id number]  Primary Physician Christain Sacramento, MD Primary Cardiologist: Dr. Meda Coffee Electrophysiologist: N/A  Reason for Visit/CC: F/u for Chest Pain, Dyspnea and Palpitations  HPI:  Heidi Tucker is a 77 y.o. female who is being seen today for f/u, after being seen by Dr. Meda Coffee on 10/31/17 for exertional CP, dyspnea and palpitations.   Pt saw Dr. Meda Coffee last month and complained of an episode of tachy palpitations while vacationing in [place]. She had also noticed significant DOE and chest discomfort in the last few months, that had gotten significantly worse when she returned from her trip. Pt noted getting SOB after walking 1 flight of stairs. She is a long term second hand smoker. She hasn't had cholesterol checked in a while but wishes not to take statins. Family history is notable for MI in father at the age of 80. Pt also goes to the gym regularly for exercise.   Labs were obtained. CBC normal w/o anemia. BMP unremarkable. D-dimer was abnormal at 0.52. Dr. Meda Coffee elected to order a coronary CTA w/ calcium score to r/o obstructive CAD as well as to check for PE, given symptoms and recent travel. The study was normal. Minimal nonobstructive CAD. Calcium score was 0. No PE was mentioned in study report.  In regards to her palpitations, Dr. Meda Coffee noted that she felt that her one episode was likely SVT, however given her lack of recurrence, Dr. Meda Coffee did not order an outpatient monitor. Dr. Meda Coffee noted to reconsider if she has recurrent symptoms.   Pt is back in clinic today for f/u. She reports that she has done fairly well. No recurrent CP or dyspnea but she has had recurrence of palpitations. She has had at least 2 episodes of tachy palpitations occurring at rest since last OV. Episodes lasted ~2 min. No syncope or near syncope. She reports good hydration. She does not drink caffeine. She is currently asymptomatic. EKG shows NSR 62 bpm.     Cardiac Studies  Coronary CTA w/ Calcium Score 10/31/2017   Aorta:  Normal size.  No calcifications.  No dissection.  Aortic Valve:  Trileaflet.  No calcifications.  Coronary Arteries:  Normal coronary origin.  Right dominance.  RCA is a large dominant artery that gives rise to PDA and PLVB. There is no plaque.  Left main is a large artery that gives rise to LAD and LCX arteries. Left maiin has no plaque.  LAD is a large vessel that gives rise to one diagonal artery and has minimal non-calcified plaque.  LCX is a medium size non-dominant artery that gives rise to two OM branches. There is no plaque.  Other findings:  Normal pulmonary vein drainage into the left atrium.  Normal let atrial appendage without a thrombus.  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  IMPRESSION: 1. Coronary calcium score of 0. This was 0 percentile for age and sex matched control.  2. Normal coronary origin with right dominance.  3.  Minimal non-obstructive CAD.  4. 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   Current Meds  Medication Sig  . aspirin 81 MG tablet Take 81 mg by mouth daily.    Marland Kitchen ezetimibe (ZETIA) 10 MG tablet TAKE 1 TABLET BY MOUTH EVERY DAY  . Multiple Vitamins-Minerals (CENTRUM SILVER PO) Take 1 tablet by mouth daily.   . Omega-3 Fatty Acids (FISH OIL) 1200 MG CAPS Take 3 capsules by mouth daily.    . primidone (MYSOLINE) 250 MG  tablet daily.  . propranolol (INDERAL LA) 160 MG SR capsule as directed. Takes half in the am, half at noon, and 1/4 qhs  . SUMAtriptan (IMITREX) 50 MG tablet    Allergies  Allergen Reactions  . Cephalexin Nausea And Vomiting  . Doxycycline Nausea And Vomiting  . Naproxen Nausea And Vomiting   Past Medical History:  Diagnosis Date  . Arthritis    hands  . Bilateral swelling of feet   . Diabetes mellitus   . Generalized headaches   . Hearing loss   . Hyperlipidemia   . Wears glasses   . Wears hearing  aid    Family History  Problem Relation Age of Onset  . Heart disease Father        heart attack  . Diabetes Father   . Diabetes Maternal Aunt    Past Surgical History:  Procedure Laterality Date  . brain transmitter  2011   help to correct tremor  . BUNIONECTOMY  2001  . CATARACT EXTRACTION, BILATERAL    . COSMETIC SURGERY    . CYSTECTOMY  1991   back  . [place]  . KNEE SURGERY    . TUBAL LIGATION  1969   Social History   Socioeconomic History  . Marital status: Married    Spouse name: Not on file  . Number of children: Not on file  . Years of education: Not on file  . Highest education level: Not on file  Occupational History  . Not on file  Social Needs  . Financial resource strain: Not on file  . Food insecurity:    Worry: Not on file    Inability: Not on file  . Transportation needs:    Medical: Not on file    Non-medical: Not on file  Tobacco Use  . Smoking status: Never Smoker  . Smokeless tobacco: Never Used  Substance and Sexual Activity  . Alcohol use: No  . Drug use: No  . Sexual activity: Yes    Partners: Male    Comment: 1ST INTERCOURSE- 69, PARTNERS- 1, MARRIED- 48 YRS   Lifestyle  . Physical activity:    Days per week: Not on file    Minutes per session: Not on file  . Stress: Not on file  Relationships  . Social connections:    Talks on phone: Not on file    Gets together: Not on file    Attends religious service: Not on file    Active member of club or organization: Not on file    Attends meetings of clubs or organizations: Not on file    Relationship status: Not on file  . Intimate partner violence:    Fear of current or ex partner: Not on file    Emotionally abused: Not on file    Physically abused: Not on file    Forced sexual activity: Not on file  Other Topics Concern  . Not on file  Social History Narrative  . Not on file     Review of Systems: General: negative for chills, fever, night sweats or  weight changes.  Cardiovascular: negative for chest pain, dyspnea on exertion, edema, orthopnea, palpitations, paroxysmal nocturnal dyspnea or shortness of breath Dermatological: negative for rash Respiratory: negative for cough or wheezing Urologic: negative for hematuria Abdominal: negative for nausea, vomiting, diarrhea, bright red blood per rectum, melena, or hematemesis Neurologic: negative for visual changes, syncope, or dizziness All other systems reviewed and are otherwise negative  except as noted above.   Physical Exam:  Blood pressure 96/64, pulse 62, height 5\' 3"  (1.6 m), weight 167 lb 12.8 oz (76.1 kg), SpO2 96 %.  General appearance: alert, cooperative and no distress Neck: no carotid bruit and no JVD Lungs: clear to auscultation bilaterally Heart: regular rate and rhythm, S1, S2 normal, no murmur, click, rub or gallop Extremities: extremities normal, atraumatic, no cyanosis or edema Pulses: 2+ and symmetric Skin: Skin color, texture, turgor normal. No rashes or lesions Neurologic: Grossly normal  EKG NSR 62 bpm -- personally reviewed   ASSESSMENT AND PLAN:   1. Exertional Chest Pain and Dyspnea: coronary CTA 10/31/17 showed mild nonobstructive CAD w/ calcium score of 0. No PE noted in result findings. CBC and BNP WNL. She has had recurrent tachypalpitations. Will further evaluate w/ 30 day monitor.   2. Palpitations: pt notes recurrent symptoms, at least 2 episodes since last OV. Occurs at random and last ~2 min. No particular patten. Does not occur daily. EKG today shows NSR, 62 bpm. She reports good hydration and does not drink caffeine. Labs at last OV showed normal H/H and electrolytes. We will obtain 30 day monitor for further evaluation.    Follow-Up; TBD based on monitor results. If normal/ benign findings, then she can plan to f/u with Dr. Meda Coffee as previously advised. If notable findings, then we will arrange sooner f/u.   Heidi Tucker, MHS [facility] 12/03/2017 10:52 AM

## 2017-12-10 ENCOUNTER — Ambulatory Visit (INDEPENDENT_AMBULATORY_CARE_PROVIDER_SITE_OTHER): Payer: Medicare Other

## 2017-12-10 DIAGNOSIS — R002 Palpitations: Secondary | ICD-10-CM | POA: Diagnosis not present

## 2017-12-12 DIAGNOSIS — M25552 Pain in left hip: Secondary | ICD-10-CM | POA: Insufficient documentation

## 2018-01-09 ENCOUNTER — Other Ambulatory Visit: Payer: Self-pay | Admitting: Obstetrics & Gynecology

## 2018-01-09 DIAGNOSIS — Z1231 Encounter for screening mammogram for malignant neoplasm of breast: Secondary | ICD-10-CM

## 2018-01-13 ENCOUNTER — Telehealth: Payer: Self-pay

## 2018-01-13 NOTE — Telephone Encounter (Signed)
Notes recorded by Frederik Schmidt, RN on 01/13/2018 at 11:46 AM EDT Informed patient of results. She verbalized understanding.

## 2018-01-13 NOTE — Telephone Encounter (Signed)
Notes recorded by Frederik Schmidt, RN on 01/13/2018 at 11:21 AM EDT [id number] 8/6 ------

## 2018-01-13 NOTE — Telephone Encounter (Signed)
-----   Message from Dorothy Spark, MD sent at 01/13/2018  9:51 AM EDT ----- Sinus bradycardia to sinus tachycardia, isolated PVCs, no arrhythmias.

## 2018-02-19 ENCOUNTER — Ambulatory Visit
Admission: RE | Admit: 2018-02-19 | Discharge: 2018-02-19 | Disposition: A | Discharge status: Home / Self Care | Payer: Medicare Other | Source: Ambulatory Visit | Attending: Obstetrics & Gynecology | Admitting: Obstetrics & Gynecology

## 2018-02-19 DIAGNOSIS — Z1231 Encounter for screening mammogram for malignant neoplasm of breast: Secondary | ICD-10-CM

## 2018-07-15 ENCOUNTER — Other Ambulatory Visit: Payer: Self-pay | Admitting: Family Medicine

## 2018-07-15 ENCOUNTER — Ambulatory Visit
Admission: RE | Admit: 2018-07-15 | Discharge: 2018-07-15 | Disposition: A | Discharge status: Home / Self Care | Payer: Medicare Other | Source: Ambulatory Visit | Attending: Family Medicine | Admitting: Family Medicine

## 2018-07-15 DIAGNOSIS — M25512 Pain in left shoulder: Secondary | ICD-10-CM

## 2018-07-16 DIAGNOSIS — M25512 Pain in left shoulder: Secondary | ICD-10-CM | POA: Insufficient documentation

## 2019-01-07 ENCOUNTER — Other Ambulatory Visit: Payer: Self-pay | Admitting: Surgery

## 2019-01-07 ENCOUNTER — Other Ambulatory Visit: Payer: Self-pay | Admitting: Family Medicine

## 2019-01-07 DIAGNOSIS — M8580 Other specified disorders of bone density and structure, unspecified site: Secondary | ICD-10-CM

## 2019-01-07 DIAGNOSIS — Z1231 Encounter for screening mammogram for malignant neoplasm of breast: Secondary | ICD-10-CM

## 2019-03-11 ENCOUNTER — Ambulatory Visit
Admission: RE | Admit: 2019-03-11 | Discharge: 2019-03-11 | Disposition: A | Discharge status: Home / Self Care | Payer: Medicare Other | Source: Ambulatory Visit | Attending: Family Medicine | Admitting: Family Medicine

## 2019-03-11 ENCOUNTER — Other Ambulatory Visit: Payer: Self-pay

## 2019-03-11 DIAGNOSIS — Z1231 Encounter for screening mammogram for malignant neoplasm of breast: Secondary | ICD-10-CM

## 2019-03-11 DIAGNOSIS — M8580 Other specified disorders of bone density and structure, unspecified site: Secondary | ICD-10-CM

## 2019-08-09 HISTORY — PX: OTHER SURGICAL HISTORY: SHX169

## 2019-10-04 NOTE — Progress Notes (Signed)
Cardiology Office Note    Date:  10/07/2019   ID:  Heidi Tucker, DOB [birth year], MRN [id number]  PCP:  Heidi Sacramento, MD  Cardiologist:  Heidi Dawley, MD  Electrophysiologist:  None   Chief Complaint: f/u PVCs, minimal CAD  History of Present Illness:   Heidi Tucker (lally-ay) is a 79 y.o. female originally from [place] with history of arthritis, DM, HLD, deep brain stimulator/tremors, PVCs and minimal CAD by CT in 2019 who presents for cardiac follow-up.  She previously was seen by Dr. Meda Tucker for chest pain, DOE and palpitations. In 2016 she had an equivocal stress test due to poor image quality which was followed up by a coronary calcium score that was zero. 2D echo 11/2014 showed EF 55-60%, grade 1 DD. She presented with recurrent symptoms in 10/2017 and underwent a full coronary CTA that showed minimal nonobstructive CAD and mildly dilated pulmonary artery. Event monitor 12/2017 showed sinus brady-sinus tach, isolated PVCs. She has been on propranolol. Last labs personally reviewed 10/2017 K 4.4, Cr 0.75, glucose 105, CBC wnl, 2016 TSH wnl. She has a family history of CAD in her father, MI in his 31s.  She returns for follow-up overall feeling similar to prior visits. She continues to note exertional dyspnea with activity. She thinks this has progressed some. She also sometimes feels a sense of chest pressure with this that is relieved with rest. She also reports episodic random fleeting chest pain that occurs about once a month lasting a few minutes, resolving spontaneously. She goes to the gym 2x a week and walks 4000 steps M-F.Marland Kitchen She has not done as much activity as prior years. No edema, orthopnea, syncope, dizziness. Blood pressure is well controlled.     Past Medical History:  Diagnosis Date  . Arthritis    hands  . Bilateral swelling of feet   . Diabetes mellitus   . Generalized headaches   . Hearing loss   . Hyperlipidemia   . Mild CAD   . PVC's (premature  ventricular contractions)   . Status post deep brain stimulator placement   . Wears glasses   . Wears hearing aid     Past Surgical History:  Procedure Laterality Date  . brain transmitter  2011   help to correct tremor  . BUNIONECTOMY  2001  . CATARACT EXTRACTION, BILATERAL    . COSMETIC SURGERY    . CYSTECTOMY  1991   back  . [place]  . KNEE SURGERY    . TUBAL LIGATION  1969    Current Medications: Current Meds  Medication Sig  . aspirin 81 MG tablet Take 81 mg by mouth daily.    Marland Kitchen ezetimibe (ZETIA) 10 MG tablet TAKE 1 TABLET BY MOUTH EVERY DAY  . Multiple Vitamins-Minerals (CENTRUM SILVER PO) Take 1 tablet by mouth daily.   . Omega-3 Fatty Acids (FISH OIL) 1200 MG CAPS Take 3 capsules by mouth daily.    Marland Kitchen OVER THE COUNTER MEDICATION Hemp oil 1 dropper full daily  . primidone (MYSOLINE) 250 MG tablet 125 mg 3 (three) times daily.   . propranolol (INDERAL LA) 160 MG SR capsule Take 160 mg by mouth daily.  . SUMAtriptan (IMITREX) 50 MG tablet Take 50 mg by mouth as needed.   Marland Kitchen VITAMIN D PO Take 1 tablet by mouth daily.  Her primidone and propranolol were originally backwards on her sheet. She confirmed that she is doing 1/2 tab of the primidone TID and  propranolol 1 whole capsule daily.    Allergies:   Cephalexin, Doxycycline, and Naproxen   Social History   Socioeconomic History  . Marital status: Married    Spouse name: Not on file  . Number of children: Not on file  . Years of education: Not on file  . Highest education level: Not on file  Occupational History  . Not on file  Tobacco Use  . Smoking status: Never Smoker  . Smokeless tobacco: Never Used  Substance and Sexual Activity  . Alcohol use: No  . Drug use: No  . Sexual activity: Yes    Partners: Male    Comment: 1ST INTERCOURSE- 28, PARTNERS- 1, MARRIED- 58 YRS   Other Topics Concern  . Not on file  Social History Narrative  . Not on file   Social Determinants of Health     Financial Resource Strain:   . Difficulty of Paying Living Expenses:   Food Insecurity:   . Worried About Charity fundraiser in the Last Year:   . Arboriculturist in the Last Year:   Transportation Needs:   . Film/video editor (Medical):   Marland Kitchen Lack of Transportation (Non-Medical):   Physical Activity:   . Days of Exercise per Week:   . Minutes of Exercise per Session:   Stress:   . Feeling of Stress :   Social Connections:   . Frequency of Communication with Friends and Family:   . Frequency of Social Gatherings with Friends and Family:   . Attends Religious Services:   . Active Member of Clubs or Organizations:   . Attends Archivist Meetings:   Marland Kitchen Marital Status:      Family History:  The patient's family history includes Diabetes in her father and maternal aunt; Heart disease in her father. There is no history of Breast cancer.  ROS:   Please see the history of present illness.  All other systems are reviewed and otherwise negative.    EKGs/Labs/Other Studies Reviewed:    Studies reviewed are outlined and summarized above. Reports included below if pertinent.  Coronary CTA w/ Calcium Score 10/31/2017   Aorta: Normal size. No calcifications. No dissection.  Aortic Valve: Trileaflet. No calcifications.  Coronary Arteries: Normal coronary origin. Right dominance.  RCA is a large dominant artery that gives rise to PDA and PLVB. There is no plaque.  Left main is a large artery that gives rise to LAD and LCX arteries. Left maiin has no plaque.  LAD is a large vessel that gives rise to one diagonal artery and has minimal non-calcified plaque.  LCX is a medium size non-dominant artery that gives rise to two OM branches. There is no plaque.  Other findings:  Normal pulmonary vein drainage into the left atrium.  Normal let atrial appendage without a thrombus.  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  IMPRESSION: 1.  Coronary calcium score of 0. This was 0 percentile for age and sex matched control.  2. Normal coronary origin with right dominance.  3. Minimal non-obstructive CAD.  4. 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   2D Echo 2016 Study Conclusions   - Left ventricle: The cavity size was normal. Systolic function was  normal. The estimated ejection fraction was in the range of 55%  to 60%. Wall motion was normal; there were no regional wall  motion abnormalities. There was an increased relative  contribution of atrial contraction to ventricular filling, which  may be  seen with aging. Doppler parameters are consistent with  abnormal left ventricular relaxation (grade 1 diastolic  dysfunction).  - Atrial septum: No defect or patent foramen ovale was identified.     EKG:  EKG is ordered today, personally reviewed, demonstrating NSR 63bpm, artifact from deep brain stimulator, but no overt STT changes, appears similar to prior  Recent Labs: No results found for requested labs within last 8760 hours.  Recent Lipid Panel    Component Value Date/Time   CHOL FAXED RESULT TO THE CLIENT_ 11/18/2014 1447   TRIG FAXED RESULT TO THE CLIENT_ 11/18/2014 1447   HDL FAXED RESULT TO THE CLIENT_ 11/18/2014 1447   [place] FAXED RESULT TO THE CLIENT_ 11/18/2014 1447    PHYSICAL EXAM:    VS:  BP 122/72   Pulse 64   Ht 5\' 3"  (1.6 m)   Wt 172 lb 12.8 oz (78.4 kg)   SpO2 96%   BMI 30.61 kg/m   BMI: Body mass index is 30.61 kg/m.  GEN: Well nourished, well developed WF, in no acute distress HEENT: normocephalic, atraumatic Neck: no JVD, carotid bruits, or masses Cardiac: RRR; no murmurs, rubs, or gallops, no edema  Respiratory:  clear to auscultation bilaterally, normal work of breathing GI: soft, nontender, nondistended, + BS MS: no deformity or atrophy Skin: warm and dry, no rash Neuro:  Alert and Oriented x 3, Strength and sensation are intact, follows commands.  Patient requested that I pull down mask to be able to see lips but unfortunately had to decline due to pandemic concerns/patient protection - she was able to process well with slower cadence of speech Psych: euthymic mood, full affect  Wt Readings from Last 3 Encounters:  10/07/19 172 lb 12.8 oz (78.4 kg)  12/03/17 167 lb 12.8 oz (76.1 kg)  10/31/17 163 lb (73.9 kg)     ASSESSMENT & PLAN:   1. DOE/chest discomfort - similar to prior visits. This sounds concerning for angina although coronary CT was previously unrevealing aside from minimal nonobstructive CAD. Symptoms have not acutely changed, but have remained somewhat progressive over several years' time. Will update labs today along with 2D echocardiogram and Lexiscan nuclear stress test (Lexiscan chosen due to EKG interference from deep brain stimulator - we did confirm with nuc med that test could be performed with this). Test details discussed with patient who is agreeable. If this is unrevealing, would plan referral to pulmonology to investigate alternative causes and perhaps trial empiric antianginal such as Imdur in case this represents microvascular angina. 2. 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 3. Minimal nonobstructive CAD - continue ASA, BB. Per Dr. Francesca Oman notes, she has been opposed to statin so has been maintained on Zetia.  4. Mildly dilated pulmonary artery by CT in 2019 - will reassess PA size and pressure by echocardiogram. If abnormal, would suggest pulmonary evaluation given dyspnea. She does report a history of pneumonia at a very early age, 20 months, but has no known chronic lung issues. 5. Hyperlipidemia - overdue for labs. Since we need to get labs to assess her dyspnea, will also get CMET/lipid profile while she is here. We do not have anything on file since 2016. This will be nonfasting but she ate a non-sugary meal for lunch.    Disposition: F/u with me virtually in 6 weeks.   Medication Adjustments/Labs and Tests  Ordered: Current medicines are reviewed at length with the patient today.  Concerns regarding medicines are outlined above. Medication changes, Labs and Tests ordered today  are summarized above and listed in the Patient Instructions accessible in Encounters.   Signed, Charlie Pitter, PA-C  10/07/2019 3:08 PM    [facility] [place], [place], [place]  [address] Phone: [phone]; Fax: [phone]

## 2019-10-07 ENCOUNTER — Encounter: Payer: Self-pay | Admitting: Physician Assistant

## 2019-10-07 ENCOUNTER — Encounter: Payer: Self-pay | Admitting: *Deleted

## 2019-10-07 ENCOUNTER — Ambulatory Visit: Payer: Medicare Other | Admitting: Physician Assistant

## 2019-10-07 ENCOUNTER — Telehealth: Payer: Self-pay | Admitting: *Deleted

## 2019-10-07 ENCOUNTER — Other Ambulatory Visit: Payer: Self-pay

## 2019-10-07 VITALS — BP 122/72 | HR 64 | Ht 63.0 in | Wt 172.8 lb

## 2019-10-07 DIAGNOSIS — I288 Other diseases of pulmonary vessels: Secondary | ICD-10-CM

## 2019-10-07 DIAGNOSIS — R0789 Other chest pain: Secondary | ICD-10-CM | POA: Diagnosis not present

## 2019-10-07 DIAGNOSIS — R0600 Dyspnea, unspecified: Secondary | ICD-10-CM

## 2019-10-07 DIAGNOSIS — I493 Ventricular premature depolarization: Secondary | ICD-10-CM

## 2019-10-07 DIAGNOSIS — E785 Hyperlipidemia, unspecified: Secondary | ICD-10-CM | POA: Diagnosis not present

## 2019-10-07 DIAGNOSIS — R0609 Other forms of dyspnea: Secondary | ICD-10-CM

## 2019-10-07 DIAGNOSIS — R079 Chest pain, unspecified: Secondary | ICD-10-CM

## 2019-10-07 DIAGNOSIS — I2510 Atherosclerotic heart disease of native coronary artery without angina pectoris: Secondary | ICD-10-CM | POA: Diagnosis not present

## 2019-10-07 NOTE — Telephone Encounter (Signed)
  Patient Consent for Virtual Visit         Heidi Tucker has provided verbal consent on 10/07/2019 for a virtual visit (video or telephone).   CONSENT FOR VIRTUAL VISIT FOR:  Heidi Tucker  By participating in this virtual visit I agree to the following:  I hereby voluntarily request, consent and authorize [facility] and its employed or contracted physicians, physician assistants, nurse practitioners or other licensed health care professionals (the Practitioner), to provide me with telemedicine health care services (the "Services") as deemed necessary by the treating Practitioner. I acknowledge and consent to receive the Services by the Practitioner via telemedicine. I understand that the telemedicine visit will involve communicating with the Practitioner through live audiovisual communication technology and the disclosure of certain medical information by electronic transmission. I acknowledge that I have been given the opportunity to request an in-person assessment or other available alternative prior to the telemedicine visit and am voluntarily participating in the telemedicine visit.  I understand that I have the right to withhold or withdraw my consent to the use of telemedicine in the course of my care at any time, without affecting my right to future care or treatment, and that the Practitioner or I may terminate the telemedicine visit at any time. I understand that I have the right to inspect all information obtained and/or recorded in the course of the telemedicine visit and may receive copies of available information for a reasonable fee.  I understand that some of the potential risks of receiving the Services via telemedicine include:  Marland Kitchen Delay or interruption in medical evaluation due to technological equipment failure or disruption; . Information transmitted may not be sufficient (e.g. poor resolution of images) to allow for appropriate medical decision making by the Practitioner;  and/or  . In rare instances, security protocols could fail, causing a breach of personal health information.  Furthermore, I acknowledge that it is my responsibility to provide information about my medical history, conditions and care that is complete and accurate to the best of my ability. I acknowledge that Practitioner's advice, recommendations, and/or decision may be based on factors not within their control, such as incomplete or inaccurate data provided by me or distortions of diagnostic images or specimens that may result from electronic transmissions. I understand that the practice of medicine is not an exact science and that Practitioner makes no warranties or guarantees regarding treatment outcomes. I acknowledge that a copy of this consent can be made available to me via my patient portal ([facility]), or I can request a printed copy by calling the office of [facility].    I understand that my insurance will be billed for this visit.   I have read or had this consent read to me. . I understand the contents of this consent, which adequately explains the benefits and risks of the Services being provided via telemedicine.  . I have been provided ample opportunity to ask questions regarding this consent and the Services and have had my questions answered to my satisfaction. . I give my informed consent for the services to be provided through the use of telemedicine in my medical care

## 2019-10-07 NOTE — Patient Instructions (Addendum)
Medication Instructions:  Your physician recommends that you continue on your current medications as directed. Please refer to the Current Medication list given to you today.  *If you need a refill on your cardiac medications before your next appointment, please call your pharmacy*   Lab Work: TODAY:  CMET, LIPID, PRO BNP, CBC, & TSH  If you have labs (blood work) drawn today and your tests are completely normal, you will receive your results only by: Marland Kitchen MyChart Message (if you have MyChart) OR . A paper copy in the mail If you have any lab test that is abnormal or we need to change your treatment, we will call you to review the results.   Testing/Procedures: Your physician has requested that you have an echocardiogram. Echocardiography is a painless test that uses sound waves to create images of your heart. It provides your doctor with information about the size and shape of your heart and how well your heart's chambers and valves are working. This procedure takes approximately one hour. There are no restrictions for this procedure.   Your physician has requested that you have a lexiscan myoview. For further information please visit [email or web address]. Please follow instruction sheet, as given.    Follow-Up: At [facility], you and your health needs are our priority.  As part of our continuing mission to provide you with exceptional heart care, we have created designated Provider Care Teams.  These Care Teams include your primary Cardiologist (physician) and Advanced Practice Providers (APPs -  Physician Assistants and Nurse Practitioners) who all work together to provide you with the care you need, when you need it.  We recommend signing up for the patient portal called "MyChart".  Sign up information is provided on this After Visit Summary.  MyChart is used to connect with patients for Virtual Visits (Telemedicine).  Patients are able to view lab/test results, encounter notes,  upcoming appointments, etc.  Non-urgent messages can be sent to your provider as well.   To learn more about what you can do with MyChart, go to [email or web address].    Your next appointment:   6 Weeks   11/11/2019 3:45.  SEE INSTRUCTIONS FOR VIRTUAL VISIT BELOW:  Your [facility] team (Cardiologist [Heart Doctor] and Advanced Practice Provider [phone] Assistant; Nurse Practitioner]) has arranged for your next office appointment to be a virtual visit (also known as "Telehealth", "Telemedicine", "E-Visit").   We now offer virtual visits for all our patients.  This helps [place] to expand our ability to see patients in a timely and safe manner.  These visits are billed to your insurance just like traditional, in person, appointments.  Please review this IMPORTANT information about your upcoming appointment.   **PLEASE READ THE SECTION BELOW LABELED "CONSENT".**   **CALL OUR OFFICE WITH QUESTIONS.**   WHAT YOU NEED FOR YOUR VIRTUAL VISIT:  You will need a SmartPhone with microphone and video capability.  [If you are using MyChart to connect to your visit, it is also possible to use a desktop/laptop computer (with an Internet connection), as long as you have microphone and video capability.]  You will need to use Chrome, Edge or [employer] as your Wellsite geologist.  We highly recommend that you have a MyChart account as this will make connecting to your visit seamless.  A MyChart account not only allows you to connect to your provider for a virtual visit, but also allows you to see the results of all your tests, provider notes, medications and upcoming appointments.  A MyChart account is not absolutely necessary.  We can still complete your visit if you do not have one.    If you do not have a computer or SmartPhone with video/microphone capability or your Internet/cell service is weak on the day of your visit, we will do your visit by telephone.    A blood pressure cuff and scale are  essential to collect your vital signs at home.  If you do not have these and you are unable to obtain them, please contact our office so that we can make arrangements for you.  If you have a pulse oximeter, Apple watch, Kardia mobile device, etc, you can collect data from these devices as well to share with the provider for your visit.  These devices are not required for a virtual visit.    WHAT TO DO ON THE DAY OF YOUR APPOINTMENT: 30 minutes before your appointment:  Take your blood pressure, pulse or heart rate (if your blood pressure machine is able to collect it) and weight.  Write all these numbers down so you can give it to the nurse or medical assistant that calls.  Get all of the medications you currently take and put them where you will be sitting for the appointment.  The nurse/medical assistant will go over these with you when he/she calls.  15 minutes before your appointment:  You will receive a phone call from a nurse or medical assistant from our office.  The caller ID on your phone may indicate that the caller is either "[facility]" or "[place]".  However, the number may come across as spam.  Please turn off any spam blocker so you do not miss the call.  The nurse will:  Ask for your blood pressure, pulse, weight, height.  Go over all of your medications to make sure your chart is correct.  Review your allergies, smoking history, reason for appointment, etc.   Give you instructions on how to connect with the video platform or telephone.  IF YOUR VISIT IS BY TELEPHONE ONLY: After the nurse finishes getting you ready for the visit, your provider will call you on the phone number you provide to [place].   TO CONNECT WITH YOUR PROVIDER FOR YOUR APPOINTMENT (BY VIDEO): You will either connect with the provider with your MyChart account or (if you are not using MyChart) with a link sent to your SmartPhone by text message.  If you are using MyChart, see below.  If you are  not using MyChart, the nurse will send you the text message during the phone call.     If you are connecting with your MyChart account:   (The nurse that calls you will tell you when to do this):  You will log into your account. At the top of your home screen, you should see the following prompt that tells you to "Begin your video visit with . . . ".  Click the green button (BEGIN VISIT).    The next screen will say "It's time to start your video visit!" Click the green button (BEGIN VIDEO VISIT)    There may be a screen that appears that asks for permission to use your camera and/or microphone.  Click ALLOW.  This will open the browser where your appointment will take place.   You may see the message: "Welcome.  Waiting for the call to begin."  Or, you will see that the nurse or your provider are already "in" the room waiting on  you.   If you are connecting with a link sent to your SmartPhone via text: The nurse that calls you will send the link by text message. Click on the link (it should look something like this):     If asked to give permission to use the camera and or microphone, click ALLOW This will open the browser where your appointment will take place.      You may see the message: "Welcome.  Waiting for the call to begin."  Or, you will see that the nurse or your provider are already "in" the room waiting on you.    The controls for your visit look like the picture below.  Please note that this is what the microphone and camera look like when they are ON.  If muted, they will have a line through them.    After the appointment: Once your provider leaves the appointment, he/she will go over instructions with the nurse/medical assistant.  If needed, this person will call you with any instructions, appointments, etc.   A copy of your After Visit Summary (AVS) will be available later that day in your MyChart account.  This document will have all of your instructions,  medications, appointments, etc.  If you are not using MyChart, we will mail it to your home.     *CONSENT FOR TELE-HEALTH VISIT - PLEASE REVIEW* By participating in the scheduled virtual visit (and any virtual visit scheduled within 365 days of the printing of this document), I agree to the following:   I hereby voluntarily request, consent and authorize [facility] and its employed or contracted physicians, physician assistants, nurse practitioners or other licensed health care professionals (the Practitioner), to provide me with telemedicine health care services (the "Services") as deemed necessary by the treating Practitioner. I acknowledge and consent to receive the Services by the Practitioner via telemedicine. I understand that the telemedicine visit will involve communicating with the Practitioner through live audiovisual communication technology and the disclosure of certain medical information by electronic transmission. I acknowledge that I have been given the opportunity to request an in-person assessment or other available alternative prior to the telemedicine visit and am voluntarily participating in the telemedicine visit.  I understand that I have the right to withhold or withdraw my consent to the use of telemedicine in the course of my care at any time, without affecting my right to future care or treatment, and that the Practitioner or I may terminate the telemedicine visit at any time. I understand that I have the right to inspect all information obtained and/or recorded in the course of the telemedicine visit and may receive copies of available information for a reasonable fee.  I understand that some of the potential risks of receiving the Services via telemedicine include:  Marland Kitchen Delay or interruption in medical evaluation due to technological equipment failure or disruption; . Information transmitted may not be sufficient (e.g. poor resolution of images) to allow for appropriate  medical decision making by the Practitioner; and/or  . In rare instances, security protocols could fail, causing a breach of personal health information.  Furthermore, I acknowledge that it is my responsibility to provide information about my medical history, conditions and care that is complete and accurate to the best of my ability. I acknowledge that Practitioner's advice, recommendations, and/or decision may be based on factors not within their control, such as incomplete or inaccurate data provided by me or distortions of diagnostic images or specimens that may result from  electronic transmissions. I understand that the practice of medicine is not an exact science and that Practitioner makes no warranties or guarantees regarding treatment outcomes. I acknowledge that a copy of this consent can be made available to me via my patient portal ([facility]), or I can request a printed copy by calling the office of [facility].    I understand that my insurance will be billed for this visit.   I have read or had this consent read to me. . I understand the contents of this consent, which adequately explains the benefits and risks of the Services being provided via telemedicine.  . I have been provided ample opportunity to ask questions regarding this consent and the Services and have had my questions answered to my satisfaction. . I give my informed consent for the services to be provided through the use of telemedicine in my medical care      The format for your next appointment:   Virtual   Provider:   You may see Ena Dawley, MD or one of the following Advanced Practice Providers on your designated Care Team:    Melina Copa, PA-C  Ermalinda Barrios, PA-C    Other Instructions  Echocardiogram An echocardiogram is a procedure that uses painless sound waves (ultrasound) to produce an image of the heart. Images from an echocardiogram can provide important information  about:  Signs of coronary artery disease (CAD).  Aneurysm detection. An aneurysm is a weak or damaged part of an artery wall that bulges out from the normal force of blood pumping through the body.  Heart size and shape. Changes in the size or shape of the heart can be associated with certain conditions, including heart failure, aneurysm, and CAD.  Heart muscle function.  Heart valve function.  Signs of a past heart attack.  Fluid buildup around the heart.  Thickening of the heart muscle.  A tumor or infectious growth around the heart valves. Tell a health care provider about:  Any allergies you have.  All medicines you are taking, including vitamins, herbs, eye drops, creams, and over-the-counter medicines.  Any blood disorders you have.  Any surgeries you have had.  Any medical conditions you have.  Whether you are pregnant or may be pregnant. What are the risks? Generally, this is a safe procedure. However, problems may occur, including:  Allergic reaction to dye (contrast) that may be used during the procedure. What happens before the procedure? No specific preparation is needed. You may eat and drink normally. What happens during the procedure?   An IV tube may be inserted into one of your veins.  You may receive contrast through this tube. A contrast is an injection that improves the quality of the pictures from your heart.  A gel will be applied to your chest.  A wand-like tool (transducer) will be moved over your chest. The gel will help to transmit the sound waves from the transducer.  The sound waves will harmlessly bounce off of your heart to allow the heart images to be captured in real-time motion. The images will be recorded on a computer. The procedure may vary among health care providers and hospitals. What happens after the procedure?  You may return to your normal, everyday life, including diet, activities, and medicines, unless your health care  provider tells you not to do that. Summary  An echocardiogram is a procedure that uses painless sound waves (ultrasound) to produce an image of the heart.  Images from  an echocardiogram can provide important information about the size and shape of your heart, heart muscle function, heart valve function, and fluid buildup around your heart.  You do not need to do anything to prepare before this procedure. You may eat and drink normally.  After the echocardiogram is completed, you may return to your normal, everyday life, unless your health care provider tells you not to do that. This information is not intended to replace advice given to you by your health care provider. Make sure you discuss any questions you have with your health care provider. Document Revised: 09/17/2018 Document Reviewed: 06/29/2016 Elsevier Patient Education  2020 [place].    Cardiac Nuclear Scan A cardiac nuclear scan is a test that is done to check the flow of blood to your heart. It is done when you are resting and when you are exercising. The test looks for problems such as:  Not enough blood reaching a portion of the heart.  The heart muscle not working as it should. You may need this test if:  You have heart disease.  You have had lab results that are not normal.  You have had heart surgery or a balloon procedure to open up blocked arteries (angioplasty).  You have chest pain.  You have shortness of breath. In this test, a special dye (tracer) is put into your bloodstream. The tracer will travel to your heart. A camera will then take pictures of your heart to see how the tracer moves through your heart. This test is usually done at a hospital and takes 2-4 hours. Tell a doctor about:  Any allergies you have.  All medicines you are taking, including vitamins, herbs, eye drops, creams, and over-the-counter medicines.  Any problems you or family members have had with anesthetic medicines.  Any  blood disorders you have.  Any surgeries you have had.  Any medical conditions you have.  Whether you are pregnant or may be pregnant. What are the risks? Generally, this is a safe test. However, problems may occur, such as:  Serious chest pain and heart attack. This is only a risk if the stress portion of the test is done.  Rapid heartbeat.  A feeling of warmth in your chest. This feeling usually does not last long.  Allergic reaction to the tracer. What happens before the test?  Ask your doctor about changing or stopping your normal medicines. This is important.  Follow instructions from your doctor about what you cannot eat or drink.  Remove your jewelry on the day of the test. What happens during the test?  An IV tube will be inserted into one of your veins.  Your doctor will give you a small amount of tracer through the IV tube.  You will wait for 20-40 minutes while the tracer moves through your bloodstream.  Your heart will be monitored with an electrocardiogram (ECG).  You will lie down on an exam table.  Pictures of your heart will be taken for about 15-20 minutes.  You may also have a stress test. For this test, one of these things may be done: ? You will be asked to exercise on a treadmill or a stationary bike. ? You will be given medicines that will make your heart work harder. This is done if you are unable to exercise.  When blood flow to your heart has peaked, a tracer will again be given through the IV tube.  After 20-40 minutes, you will get back on the exam  table. More pictures will be taken of your heart.  Depending on the tracer that is used, more pictures may need to be taken 3-4 hours later.  Your IV tube will be removed when the test is over. The test may vary among doctors and hospitals. What happens after the test?  Ask your doctor: ? Whether you can return to your normal schedule, including diet, activities, and medicines. ? Whether you  should drink more fluids. This will help to remove the tracer from your body. Drink enough fluid to keep your pee (urine) pale yellow.  Ask your doctor, or the department that is doing the test: ? When will my results be ready? ? How will I get my results? Summary  A cardiac nuclear scan is a test that is done to check the flow of blood to your heart.  Tell your doctor whether you are pregnant or may be pregnant.  Before the test, ask your doctor about changing or stopping your normal medicines. This is important.  Ask your doctor whether you can return to your normal activities. You may be asked to drink more fluids. This information is not intended to replace advice given to you by your health care provider. Make sure you discuss any questions you have with your health care provider. Document Revised: 09/16/2018 Document Reviewed: 11/10/2017 Elsevier Patient Education  [place].

## 2019-10-08 ENCOUNTER — Telehealth: Payer: Self-pay | Admitting: *Deleted

## 2019-10-08 DIAGNOSIS — I2510 Atherosclerotic heart disease of native coronary artery without angina pectoris: Secondary | ICD-10-CM

## 2019-10-08 DIAGNOSIS — E785 Hyperlipidemia, unspecified: Secondary | ICD-10-CM

## 2019-10-08 LAB — COMPREHENSIVE METABOLIC PANEL
ALT: 15 IU/L (ref 0–32)
AST: 21 IU/L (ref 0–40)
Albumin/Globulin Ratio: 1.9 (ref 1.2–2.2)
Albumin: 4.3 g/dL (ref 3.7–4.7)
Alkaline Phosphatase: 101 IU/L (ref 39–117)
BUN/Creatinine Ratio: 23 (ref 12–28)
BUN: 17 mg/dL (ref 8–27)
Bilirubin Total: <0.2 mg/dL (ref 0.0–1.2)
CO2: 22 mmol/L (ref 20–29)
Calcium: 9.7 mg/dL (ref 8.7–10.3)
Chloride: 105 mmol/L (ref 96–106)
Creatinine, Ser: 0.75 mg/dL (ref 0.57–1.00)
GFR calc Af Amer: 88 mL/min/{1.73_m2} (ref >59)
GFR calc non Af Amer: 76 mL/min/{1.73_m2} (ref >59)
Globulin, Total: 2.3 g/dL (ref 1.5–4.5)
Glucose: 100 mg/dL — ABNORMAL HIGH (ref 65–99)
Potassium: 4.6 mmol/L (ref 3.5–5.2)
Sodium: 143 mmol/L (ref 134–144)
Total Protein: 6.6 g/dL (ref 6.0–8.5)

## 2019-10-08 LAB — LIPID PANEL
Chol/HDL Ratio: 3.6 ratio (ref 0.0–4.4)
Cholesterol, Total: 216 mg/dL — ABNORMAL HIGH (ref 100–199)
HDL: 60 mg/dL (ref >39)
LDL Chol Calc (NIH): 125 mg/dL — ABNORMAL HIGH (ref 0–99)
Triglycerides: 175 mg/dL — ABNORMAL HIGH (ref 0–149)
VLDL Cholesterol Cal: 31 mg/dL (ref 5–40)

## 2019-10-08 LAB — CBC
Hematocrit: 38.2 % (ref 34.0–46.6)
Hemoglobin: 12.8 g/dL (ref 11.1–15.9)
MCH: 30.3 pg (ref 26.6–33.0)
MCHC: 33.5 g/dL (ref 31.5–35.7)
MCV: 90 fL (ref 79–97)
Platelets: 209 10*3/uL (ref 150–450)
RBC: 4.23 x10E6/uL (ref 3.77–5.28)
RDW: 12.3 % (ref 11.7–15.4)
WBC: 6.3 10*3/uL (ref 3.4–10.8)

## 2019-10-08 LAB — TSH: TSH: 2.38 u[IU]/mL (ref 0.450–4.500)

## 2019-10-08 LAB — PRO B NATRIURETIC PEPTIDE: NT-Pro BNP: 150 pg/mL (ref 0–738)

## 2019-10-08 MED ORDER — ROSUVASTATIN CALCIUM 5 MG PO TABS
5.0000 mg | ORAL_TABLET | Freq: Every day | ORAL | 3 refills | Status: DC
Start: 2019-10-08 — End: 2021-09-04

## 2019-10-08 NOTE — Telephone Encounter (Signed)
-----   Message from Charlie Pitter, [place] sent at 10/08/2019  2:18 PM EDT ----- Please let pt know labs are OK except cholesterol is high. Glucose 1 pt above normal. Triglycerides are up along with LDL. Would like to see them improved given family history of CAD and prior plaque on CT scan. She has declined statins in the past. See if willing to revisit - would start Crestor 5mg  daily with recheck LFTs/lipids in 6 weeks. If not, can offer to set up in lipid clinic to discuss non-statin options. Otherwise continue Zetia.

## 2019-10-27 ENCOUNTER — Telehealth (HOSPITAL_COMMUNITY): Payer: Self-pay | Admitting: *Deleted

## 2019-10-27 DIAGNOSIS — D2361 Other benign neoplasm of skin of right upper limb, including shoulder: Secondary | ICD-10-CM | POA: Insufficient documentation

## 2019-10-27 DIAGNOSIS — D492 Neoplasm of unspecified behavior of bone, soft tissue, and skin: Secondary | ICD-10-CM | POA: Insufficient documentation

## 2019-10-27 DIAGNOSIS — D485 Neoplasm of uncertain behavior of skin: Secondary | ICD-10-CM | POA: Insufficient documentation

## 2019-10-27 NOTE — Telephone Encounter (Signed)
Patient given detailed instructions per Myocardial Perfusion Study Information Sheet for the test on 10/29/19 at 10:30.  To be here at 10:00. Patient notified to arrive 15 minutes early and that it is imperative to arrive on time for appointment to keep from having the test rescheduled.  If you need to cancel or reschedule your appointment, please call the office within 24 hours of your appointment. . Patient verbalized understanding.Heidi Tucker

## 2019-10-29 ENCOUNTER — Encounter: Payer: Self-pay | Admitting: Physician Assistant

## 2019-10-29 ENCOUNTER — Other Ambulatory Visit: Payer: Self-pay

## 2019-10-29 ENCOUNTER — Ambulatory Visit (HOSPITAL_COMMUNITY): Payer: Medicare Other | Attending: Cardiology

## 2019-10-29 ENCOUNTER — Ambulatory Visit (HOSPITAL_BASED_OUTPATIENT_CLINIC_OR_DEPARTMENT_OTHER): Payer: Medicare Other

## 2019-10-29 DIAGNOSIS — R0789 Other chest pain: Secondary | ICD-10-CM | POA: Diagnosis present

## 2019-10-29 DIAGNOSIS — R079 Chest pain, unspecified: Secondary | ICD-10-CM | POA: Diagnosis present

## 2019-10-29 DIAGNOSIS — R0600 Dyspnea, unspecified: Secondary | ICD-10-CM | POA: Insufficient documentation

## 2019-10-29 DIAGNOSIS — I493 Ventricular premature depolarization: Secondary | ICD-10-CM | POA: Insufficient documentation

## 2019-10-29 DIAGNOSIS — E785 Hyperlipidemia, unspecified: Secondary | ICD-10-CM

## 2019-10-29 DIAGNOSIS — I288 Other diseases of pulmonary vessels: Secondary | ICD-10-CM | POA: Insufficient documentation

## 2019-10-29 DIAGNOSIS — I2510 Atherosclerotic heart disease of native coronary artery without angina pectoris: Secondary | ICD-10-CM

## 2019-10-29 DIAGNOSIS — R0609 Other forms of dyspnea: Secondary | ICD-10-CM

## 2019-10-29 LAB — MYOCARDIAL PERFUSION IMAGING
LV dias vol: 46 mL (ref 46–106)
LV sys vol: 14 mL
Peak HR: 96 {beats}/min
Rest HR: 54 {beats}/min
SDS: 1
SRS: 0
SSS: 1
TID: 1.03

## 2019-10-29 LAB — ECHOCARDIOGRAM COMPLETE
Height: 63 in
Weight: 2752 oz

## 2019-10-29 MED ORDER — TECHNETIUM TC 99M TETROFOSMIN IV KIT
10.6000 | PACK | Freq: Once | INTRAVENOUS | Status: AC | PRN
  Administered 2019-10-29: 10.6 via INTRAVENOUS
  Filled 2019-10-29: qty 11

## 2019-10-29 MED ORDER — TECHNETIUM TC 99M TETROFOSMIN IV KIT
32.2000 | PACK | Freq: Once | INTRAVENOUS | Status: AC | PRN
  Filled 2019-10-29: qty 33

## 2019-10-29 MED ORDER — REGADENOSON 0.4 MG/5ML IV SOLN: 0.4000 mg | Freq: Once | INTRAVENOUS | Status: AC

## 2019-11-10 DIAGNOSIS — T8149XA Infection following a procedure, other surgical site, initial encounter: Secondary | ICD-10-CM | POA: Insufficient documentation

## 2019-11-11 ENCOUNTER — Encounter: Payer: Self-pay | Admitting: Physician Assistant

## 2019-11-11 ENCOUNTER — Other Ambulatory Visit: Payer: Self-pay

## 2019-11-11 ENCOUNTER — Ambulatory Visit: Payer: Medicare Other | Admitting: Physician Assistant

## 2019-11-11 VITALS — BP 108/58 | HR 63 | Ht 63.0 in | Wt 169.8 lb

## 2019-11-11 DIAGNOSIS — R0600 Dyspnea, unspecified: Secondary | ICD-10-CM | POA: Diagnosis not present

## 2019-11-11 DIAGNOSIS — E785 Hyperlipidemia, unspecified: Secondary | ICD-10-CM

## 2019-11-11 DIAGNOSIS — R0609 Other forms of dyspnea: Secondary | ICD-10-CM

## 2019-11-11 DIAGNOSIS — I77810 Thoracic aortic ectasia: Secondary | ICD-10-CM

## 2019-11-11 DIAGNOSIS — I2510 Atherosclerotic heart disease of native coronary artery without angina pectoris: Secondary | ICD-10-CM

## 2019-11-11 NOTE — Patient Instructions (Signed)
Medication Instructions:  Your physician recommends that you continue on your current medications as directed. Please refer to the Current Medication list given to you today.  *If you need a refill on your cardiac medications before your next appointment, please call your pharmacy*   Lab Work: 11/19/2019:  COME FOR FASTING LABS ANYTIME AFTER 7:30 AM.  If you have labs (blood work) drawn today and your tests are completely normal, you will receive your results only by:  MyChart Message (if you have MyChart) OR  A paper copy in the mail If you have any lab test that is abnormal or we need to change your treatment, we will call you to review the results.   Testing/Procedures: None ordered   Follow-Up: At [facility], you and your health needs are our priority.  As part of our continuing mission to provide you with exceptional heart care, we have created designated Provider Care Teams.  These Care Teams include your primary Cardiologist (physician) and Advanced Practice Providers (APPs -  Physician Assistants and Nurse Practitioners) who all work together to provide you with the care you need, when you need it.  We recommend signing up for the patient portal called "MyChart".  Sign up information is provided on this After Visit Summary.  MyChart is used to connect with patients for Virtual Visits (Telemedicine).  Patients are able to view lab/test results, encounter notes, upcoming appointments, etc.  Non-urgent messages can be sent to your provider as well.   To learn more about what you can do with MyChart, go to [email or web address].    Your next appointment:   6 month(s)  The format for your next appointment:   In Person  Provider:   You may see Ena Dawley, MD or one of the following Advanced Practice Providers on your designated Care Team:    Melina Copa, PA-C  Ermalinda Barrios, PA-C    Other Instructions Let us know what your Neurologist plans to do with your  Propanolol

## 2019-11-11 NOTE — Progress Notes (Deleted)
{Choose 1 Note Type (Video or Telephone):[phone]}   The patient was identified using 2 identifiers.  Date:  11/11/2019   ID:  Heidi Tucker, DOB [birth year], MRN [id number]  Patient Location: Home Provider Location: Office  PCP:  Christain Sacramento, MD  Cardiologist:  Ena Dawley, MD  Electrophysiologist:  None   Evaluation Performed:  Follow-Up Visit  Chief Complaint:  Discuss test results  History of Present Illness:    Heidi Tucker (lally-ay) is a 79 y.o. female originally from [place] with history of arthritis, DM, HLD, mild dilation of ascending aorta, deep brain stimulator/tremors, PVCs and minimal CAD by CT in 2019 who presents to discuss recent cardiac testing.   She has a family history of CAD in her father, MI in his 23s. She previously was seen by Dr. Meda Coffee for chest pain, DOE and palpitations. In 2016 she had an equivocal stress test due to poor image quality which was followed up by a coronary calcium score that was zero. 2D echo 11/2014 showed EF 55-60%, grade 1 DD. She presented with recurrent symptoms in 10/2017 and underwent a full coronary CTA that showed minimal nonobstructive CAD and mildly dilated pulmonary artery. Event monitor 12/2017 showed sinus brady-sinus tach, isolated PVCs. She has been on propranolol. She was recently seen in clinic for exertional dyspnea similar to prior, with mild progression in the context of decreasing physical activity. She goes to the gym 2x a week and walks 4000 steps M-F. 2D echo was done 10/29/19 showing EF 55-60%, grade 1 DD, mild dilation of ascending aorta. NST 10/29/19 study result is outlined below - I had Dr. Meda Coffee review the images who felt it was completely normal. She goes to the gym 2x a week and walks 4000 steps M-F.   Last labs personally reviewed 09/2019 normal TSH, CBC, pBNP, CMET, K 4.6, LDL 125. Based on these values we started Crestor with f/u labs planned 11/19/19.   labs 6/11   Exertional dyspnea Mild  CAD Hyperlipidemia Mildly dilated aorta     Past Medical History:  Diagnosis Date  . Arthritis    hands  . Bilateral swelling of feet   . Diabetes mellitus   . Generalized headaches   . Hearing loss   . Hyperlipidemia   . Mild CAD   . Mild dilation of ascending aorta (HCC)   . PVC's (premature ventricular contractions)   . Status post deep brain stimulator placement   . Wears glasses   . Wears hearing aid    Past Surgical History:  Procedure Laterality Date  . brain transmitter  2011   help to correct tremor  . BUNIONECTOMY  2001  . CATARACT EXTRACTION, BILATERAL    . COSMETIC SURGERY    . CYSTECTOMY  1991   back  . [place]  . KNEE SURGERY    . TUBAL LIGATION  1969     No outpatient medications have been marked as taking for the 11/11/19 encounter (Appointment) with Charlie Pitter, PA-C.     Allergies:   Cephalexin, Doxycycline, and Naproxen   Social History   Tobacco Use  . Smoking status: Never Smoker  . Smokeless tobacco: Never Used  Substance Use Topics  . Alcohol use: No  . Drug use: No     Family Hx: The patient's family history includes Diabetes in her father and maternal aunt; Heart disease in her father. There is no history of Breast cancer.  ROS:   Please see the  history of present illness.    *** All other systems reviewed and are negative.   Prior CV studies:   The following studies were reviewed today:  NST 10/29/19  The left ventricular ejection fraction is hyperdynamic (>65%).  Nuclear stress EF: 69%.  There was no ST segment deviation noted during stress.  This is a low risk study.   There is a mild defect at rest seen throughout a large portion of the apical and inferior regions. This normalizes with stress. Cannot determine if this is baseline artifact or hibernating myocardium. Normal perfusion at stress, no ischemia seen.  2D echo 10/29/19 1. Normal LV systolic function; grade 1 diastolic  dysfunction; mildly  dilated ascending aorta.  2. Left ventricular ejection fraction, by estimation, is 55 to 60%. The  left ventricle has normal function. The left ventricle has no regional  wall motion abnormalities. Left ventricular diastolic parameters are  consistent with Grade I diastolic  dysfunction (impaired relaxation).  3. Right ventricular systolic function is normal. The right ventricular  size is normal. Tricuspid regurgitation signal is inadequate for assessing  PA pressure.  4. The mitral valve is abnormal. Trivial mitral valve regurgitation. No  evidence of mitral stenosis.  5. The aortic valve is tricuspid. Aortic valve regurgitation is not  visualized. Mild aortic valve sclerosis is present, with no evidence of  aortic valve stenosis.  6. Aortic dilatation noted. There is mild dilatation of the ascending  aorta measuring 38 mm.  7. The inferior vena cava is normal in size with greater than 50%  respiratory variability, suggesting right atrial pressure of 3 mmHg.     Labs/Other Tests and Data Reviewed:    EKG:  An ECG dated 10/07/19 was personally reviewed today and demonstrated:  NSR 63bpm, artifact from deep brain stimulator, but no overt STT changes, appears similar to prior  Recent Labs: 10/07/2019: ALT 15; BUN 17; Creatinine, Ser 0.75; Hemoglobin 12.8; NT-Pro BNP 150; Platelets 209; Potassium 4.6; Sodium 143; TSH 2.380   Recent Lipid Panel Lab Results  Component Value Date/Time   CHOL 216 (H) 10/07/2019 03:32 PM   CHOL FAXED RESULT TO THE CLIENT_ 11/18/2014 02:47 PM   TRIG 175 (H) 10/07/2019 03:32 PM   TRIG FAXED RESULT TO THE CLIENT_ 11/18/2014 02:47 PM   HDL 60 10/07/2019 03:32 PM   HDL FAXED RESULT TO THE CLIENT_ 11/18/2014 02:47 PM   CHOLHDL 3.6 10/07/2019 03:32 PM   LDLCALC 125 (H) 10/07/2019 03:32 PM   [place] FAXED RESULT TO THE CLIENT_ 11/18/2014 02:47 PM    Wt Readings from Last 3 Encounters:  10/29/19 172 lb (78 kg)  10/07/19 172 lb  12.8 oz (78.4 kg)  12/03/17 167 lb 12.8 oz (76.1 kg)     Objective:    Vital Signs:  There were no vitals taken for this visit.   VS reviewed. General - *** in no acute distress Pulm - No labored breathing, no coughing during visit, no audible wheezing, speaking in full sentences Neuro - A+Ox3, no slurred speech, answers questions appropriately Psych - Pleasant affect  ASSESSMENT & PLAN:    1. ***   Time:   Today, I have spent *** minutes with the patient with telehealth technology discussing the above problems.     Medication Adjustments/Labs and Tests Ordered: Current medicines are reviewed at length with the patient today.  Testing and concerns regarding medicines are outlined above.    Follow Up:  {F/U Format:[phone]} {follow up:15908}  Signed, Charlie Pitter, PA-C  11/11/2019 8:34 AM    [facility]

## 2019-11-11 NOTE — Progress Notes (Signed)
Cardiology Office Note    Date:  11/11/2019   ID:  Heidi Tucker, DOB [birth year], MRN [id number]  PCP:  Christain Sacramento, MD  Cardiologist:  Ena Dawley, MD  Electrophysiologist:  None   Chief Complaint: discuss test results, f/u dyspnea  History of Present Illness:   Heidi Tucker is a 79 y.o. female originally from [place] with history of arthritis, DM, HLD, mild dilation of ascending aorta, deep brain stimulator/tremors, PVCs and minimal CAD by CT in 2019 who presents to discuss recent cardiac testing.   She has a family history of CAD in her father, MI in his 45s. She previously was seen by Dr. Meda Coffee for chest pain, DOE and palpitations. In 2016 she had an equivocal stress test due to poor image quality which was followed up by a coronary calcium score that was zero. 2D echo 11/2014 showed EF 55-60%, grade 1 DD. She presented with recurrent symptoms in 10/2017 and underwent a full coronary CTA that showed minimal nonobstructive CAD and mildly dilated pulmonary artery. Event monitor 12/2017 showed sinus brady-sinus tach, isolated PVCs. She has been on propranolol. She was recently seen in clinic for exertional dyspnea similar to prior, with mild progression in the context of decreasing physical activity. She goes to the gym 2x a week and walks 4000 steps M-F. 2D echo was done 10/29/19 showing EF 55-60%, grade 1 DD, mild dilation of ascending aorta. NST 10/29/19 study result is outlined below - I had Dr. Meda Coffee review the images who felt it was completely normal. She goes to the gym 2x a week and walks 4000 steps M-F. She designs personalized book bindings. Last labs personally reviewed 09/2019 normal TSH, CBC, pBNP, CMET, K 4.6, LDL 125. Based on these values we started Crestor with f/u labs planned 11/19/19.  She is seen back for follow-up and feeling stable. In retrospect she says her dyspnea on exertion is better this year compared to last year because last year she was not doing as much  activity with the pandemic. It has improved with increasing her physical activity. No other acute complaints today. She is tolerating Crestor well. She has a cardiologist friend who recently suggested she may need to be on a higher dose. She sees her neurologist soon to discuss potentially adjusting her medication regimen (I.e. propranolol) since her tremors have improved with brain stimulator.   Past Medical History:  Diagnosis Date  . Arthritis    hands  . Bilateral swelling of feet   . Diabetes mellitus   . Generalized headaches   . Hearing loss   . Hyperlipidemia   . Mild CAD   . Mild dilation of ascending aorta (HCC)   . PVC's (premature ventricular contractions)   . Status post deep brain stimulator placement   . Wears glasses   . Wears hearing aid     Past Surgical History:  Procedure Laterality Date  . brain transmitter  2011   help to correct tremor  . BUNIONECTOMY  2001  . CATARACT EXTRACTION, BILATERAL    . COSMETIC SURGERY    . CYSTECTOMY  1991   back  . [place]  . KNEE SURGERY    . TUBAL LIGATION  1969    Current Medications: Current Meds  Medication Sig  . aspirin 81 MG tablet Take 81 mg by mouth daily.    Marland Kitchen ezetimibe (ZETIA) 10 MG tablet TAKE 1 TABLET BY MOUTH EVERY DAY  . Multiple Vitamins-Minerals (CENTRUM SILVER  PO) Take 1 tablet by mouth daily.   . Omega-3 Fatty Acids (FISH OIL) 1200 MG CAPS Take 3 capsules by mouth daily.    Marland Kitchen OVER THE COUNTER MEDICATION Hemp oil 1 dropper full daily  . primidone (MYSOLINE) 250 MG tablet 125 mg 3 (three) times daily.   . propranolol (INDERAL LA) 160 MG SR capsule Take 160 mg by mouth daily.   . rosuvastatin (CRESTOR) 5 MG tablet Take 1 tablet (5 mg total) by mouth daily.  . SUMAtriptan (IMITREX) 50 MG tablet Take 50 mg by mouth as needed.   Marland Kitchen VITAMIN D PO Take 1 tablet by mouth daily.      Allergies:   Cephalexin, Doxycycline, and Naproxen   Social History   Socioeconomic History  .  Marital status: Married    Spouse name: Not on file  . Number of children: Not on file  . Years of education: Not on file  . Highest education level: Not on file  Occupational History  . Not on file  Tobacco Use  . Smoking status: Never Smoker  . Smokeless tobacco: Never Used  Substance and Sexual Activity  . Alcohol use: No  . Drug use: No  . Sexual activity: Yes    Partners: Male    Comment: 1ST INTERCOURSE- 37, PARTNERS- 2, MARRIED- 13 YRS   Other Topics Concern  . Not on file  Social History Narrative  . Not on file   Social Determinants of Health   Financial Resource Strain:   . Difficulty of Paying Living Expenses:   Food Insecurity:   . Worried About Charity fundraiser in the Last Year:   . Arboriculturist in the Last Year:   Transportation Needs:   . Film/video editor (Medical):   Marland Kitchen Lack of Transportation (Non-Medical):   Physical Activity:   . Days of Exercise per Week:   . Minutes of Exercise per Session:   Stress:   . Feeling of Stress :   Social Connections:   . Frequency of Communication with Friends and Family:   . Frequency of Social Gatherings with Friends and Family:   . Attends Religious Services:   . Active Member of Clubs or Organizations:   . Attends Archivist Meetings:   Marland Kitchen Marital Status:      Family History:  The patient's family history includes Diabetes in her father and maternal aunt; Heart disease in her father. There is no history of Breast cancer.  ROS:   Please see the history of present illness.  All other systems are reviewed and otherwise negative.    EKGs/Labs/Other Studies Reviewed:    Studies reviewed are outlined and summarized above. Reports included below if pertinent.  NST 10/29/19  The left ventricular ejection fraction is hyperdynamic (>65%).  Nuclear stress EF: 69%.  There was no ST segment deviation noted during stress.  This is a low risk study.  There is a mild defect at rest seen  throughout a large portion of the apical and inferior regions. This normalizes with stress. Cannot determine if this is baseline artifact or hibernating myocardium. Normal perfusion at stress, no ischemia seen.  2D echo 10/29/19 1. Normal LV systolic function; grade 1 diastolic dysfunction; mildly  dilated ascending aorta.  2. Left ventricular ejection fraction, by estimation, is 55 to 60%. The  left ventricle has normal function. The left ventricle has no regional  wall motion abnormalities. Left ventricular diastolic parameters are  consistent with Grade I  diastolic  dysfunction (impaired relaxation).  3. Right ventricular systolic function is normal. The right ventricular  size is normal. Tricuspid regurgitation signal is inadequate for assessing  PA pressure.  4. The mitral valve is abnormal. Trivial mitral valve regurgitation. No  evidence of mitral stenosis.  5. The aortic valve is tricuspid. Aortic valve regurgitation is not  visualized. Mild aortic valve sclerosis is present, with no evidence of  aortic valve stenosis.  6. Aortic dilatation noted. There is mild dilatation of the ascending  aorta measuring 38 mm.  7. The inferior vena cava is normal in size with greater than 50%  respiratory variability, suggesting right atrial pressure of 3 mmHg.     EKG:  An ECG dated 10/07/19 was personally reviewed today and demonstrated:  NSR 63bpm, artifact from deep brain stimulator, but no overt STT changes, appears similar to prior  Recent Labs: 10/07/2019: ALT 15; BUN 17; Creatinine, Ser 0.75; Hemoglobin 12.8; NT-Pro BNP 150; Platelets 209; Potassium 4.6; Sodium 143; TSH 2.380  Recent Lipid Panel    Component Value Date/Time   CHOL 216 (H) 10/07/2019 1532   CHOL FAXED RESULT TO THE CLIENT_ 11/18/2014 1447   TRIG 175 (H) 10/07/2019 1532   TRIG FAXED RESULT TO THE CLIENT_ 11/18/2014 1447   HDL 60 10/07/2019 1532   HDL FAXED RESULT TO THE CLIENT_ 11/18/2014 1447   CHOLHDL 3.6  10/07/2019 1532   LDLCALC 125 (H) 10/07/2019 1532   [place] FAXED RESULT TO THE CLIENT_ 11/18/2014 1447    PHYSICAL EXAM:    VS:  BP (!) 108/58   Pulse 63   Ht 5\' 3"  (1.6 m)   Wt 169 lb 12.8 oz (77 kg)   SpO2 95%   BMI 30.08 kg/m   BMI: Body mass index is 30.08 kg/m.  GEN: Well nourished, well developed WF, in no acute distress HEENT: normocephalic, atraumatic Neck: no JVD, carotid bruits, or masses Cardiac: RRR; no murmurs, rubs, or gallops, no edema  Respiratory:  clear to auscultation bilaterally, normal work of breathing GI: soft, nontender, nondistended, + BS MS: no deformity or atrophy Skin: warm and dry, no rash Neuro:  Alert and Oriented x 3, Strength and sensation are intact, follows commands, hard of hearing Psych: euthymic mood, full affect  Wt Readings from Last 3 Encounters:  11/11/19 169 lb 12.8 oz (77 kg)  10/29/19 172 lb (78 kg)  10/07/19 172 lb 12.8 oz (78.4 kg)     ASSESSMENT & PLAN:   1. Exertional dyspnea - cardiac workup above reassuring, suspect due to deconditioning when she decreased her exercise during the pandemic. We discussed continuing to gradually increase physical activity. She has no further dyspnea with normal walking. She is currently quite active and exercises regularly on the treadmill. If dyspnea fails to improve or worsens, would consider referral to pulmonary for evaluation. I have also seen some cases where higher doses of beta blockers can lead to some chronotropic incompetence-related dyspnea - would consider decreasing propranolol dose. She says she actually has f/u later this month with neurology to discuss. I asked her to keep [place] posted. 2. Mild CAD - recent NST normal per Dr. Francesca Oman review. Continue current regimen otherwise. 3. Hyperlipidemia - tolerating Crestor well. Amenable to increase if needed. Recheck labs scheduled for June 11. 4. Mildly dilated aorta - noted on echocardiogram. BP well controlled. Consider echocardiogram  in 10/2020. This can be arranged closer to that time at next appt.   Disposition: F/u with Dr. Meda Coffee in 6  months.  Medication Adjustments/Labs and Tests Ordered: Current medicines are reviewed at length with the patient today.  Concerns regarding medicines are outlined above. Medication changes, Labs and Tests ordered today are summarized above and listed in the Patient Instructions accessible in Encounters.   Signed, Charlie Pitter, PA-C  11/11/2019 4:09 PM    [facility] [address], [place], [place]  [address] Phone: [phone]; Fax: [phone]

## 2019-11-19 ENCOUNTER — Other Ambulatory Visit: Payer: Medicare Other

## 2020-04-26 ENCOUNTER — Other Ambulatory Visit: Payer: Self-pay | Admitting: Family Medicine

## 2020-04-26 DIAGNOSIS — Z1231 Encounter for screening mammogram for malignant neoplasm of breast: Secondary | ICD-10-CM

## 2020-06-07 ENCOUNTER — Ambulatory Visit
Admission: RE | Admit: 2020-06-07 | Discharge: 2020-06-07 | Disposition: A | Discharge status: Home / Self Care | Payer: Medicare Other | Source: Ambulatory Visit | Attending: Family Medicine | Admitting: Family Medicine

## 2020-06-07 ENCOUNTER — Other Ambulatory Visit: Payer: Self-pay

## 2020-06-07 DIAGNOSIS — Z1231 Encounter for screening mammogram for malignant neoplasm of breast: Secondary | ICD-10-CM

## 2020-08-31 ENCOUNTER — Ambulatory Visit: Payer: Medicare Other | Admitting: Obstetrics & Gynecology

## 2020-08-31 ENCOUNTER — Other Ambulatory Visit: Payer: Self-pay

## 2020-08-31 ENCOUNTER — Encounter: Payer: Self-pay | Admitting: Obstetrics & Gynecology

## 2020-08-31 VITALS — BP 132/84 | Ht 62.0 in | Wt 168.0 lb

## 2020-08-31 DIAGNOSIS — Z01419 Encounter for gynecological examination (general) (routine) without abnormal findings: Secondary | ICD-10-CM

## 2020-08-31 DIAGNOSIS — M8589 Other specified disorders of bone density and structure, multiple sites: Secondary | ICD-10-CM | POA: Diagnosis not present

## 2020-08-31 DIAGNOSIS — Z780 Asymptomatic menopausal state: Secondary | ICD-10-CM

## 2020-08-31 NOTE — Progress Notes (Signed)
Heidi Tucker 1941/01/20 [id number]   History:    80 y.o. G2P2  Married.  From [place], speaks [place]  RP:  Established patient presenting for annual gyn exam   HPI:  Postmenopause.  No HRT.  No PMB.  No Pelvic pain.  Abstinent.  Breasts wnl.  Urine normal.  Stable SUI. BMs wnl.  BMI 30.73.  Good fitness and healthy nutrition.  Health labs with Fam MD.  Past medical history,surgical history, family history and social history were all reviewed and documented in the EPIC chart.  Gynecologic History No LMP recorded. Patient is postmenopausal.  Obstetric History OB History  Gravida Para Term Preterm AB Living  2 2       2   SAB IAB Ectopic Multiple Live Births               # Outcome Date GA Lbr Len/2nd Weight Sex Delivery Anes PTL Lv  2 Para           1 Para              ROS: A ROS was performed and pertinent positives and negatives are included in the history.  GENERAL: No fevers or chills. HEENT: No change in vision, no earache, sore throat or sinus congestion. NECK: No pain or stiffness. CARDIOVASCULAR: No chest pain or pressure. No palpitations. PULMONARY: No shortness of breath, cough or wheeze. GASTROINTESTINAL: No abdominal pain, nausea, vomiting or diarrhea, melena or bright red blood per rectum. GENITOURINARY: No urinary frequency, urgency, hesitancy or dysuria. MUSCULOSKELETAL: No joint or muscle pain, no back pain, no recent trauma. DERMATOLOGIC: No rash, no itching, no lesions. ENDOCRINE: No polyuria, polydipsia, no heat or cold intolerance. No recent change in weight. HEMATOLOGICAL: No anemia or easy bruising or bleeding. NEUROLOGIC: No headache, seizures, numbness, tingling or weakness. PSYCHIATRIC: No depression, no loss of interest in normal activity or change in sleep pattern.     Exam:   BP 132/84   Ht 5\' 2"  (1.575 m)   Wt 168 lb (76.2 kg)   BMI 30.73 kg/m   Body mass index is 30.73 kg/m.  General appearance : Well developed well nourished female. No  acute distress HEENT: Eyes: no retinal hemorrhage or exudates,  Neck supple, trachea midline, no carotid bruits, no thyroidmegaly Lungs: Clear to auscultation, no rhonchi or wheezes, or rib retractions  Heart: Regular rate and rhythm, no murmurs or gallops Breast:Examined in sitting and supine position were symmetrical in appearance, no palpable masses or tenderness,  no skin retraction, no nipple inversion, no nipple discharge, no skin discoloration, no axillary or supraclavicular lymphadenopathy Abdomen: no palpable masses or tenderness, no rebound or guarding Extremities: no edema or skin discoloration or tenderness  Pelvic: Vulva: Normal             Vagina: No gross lesions or discharge  Cervix: No gross lesions or discharge  Uterus  AV, normal size, shape and consistency, non-tender and mobile  Adnexa  Without masses or tenderness  Anus: Normal   Assessment/Plan:  80 y.o. female for annual exam   1. Well female exam with routine gynecological exam Normal gynecologic exam.  No indication for Pap test this year.  Breast exam normal.  Screening mammogram December 2021 was negative.  Colonoscopy 2016.  Health labs with family physician.  Body mass index 30.73.  Good fitness and healthy nutrition.  2. Postmenopause Well on no hormone replacement therapy.  No postmenopausal bleeding.  3. Osteopenia of multiple sites Osteopenia at multiple  sites in October 2020.  We will repeat a bone density in October 2022.  Continue with vitamin D supplements, calcium intake of 1.5 g/day total and regular weightbearing physical activities.  Princess Bruins MD, 2:22 PM 08/31/2020

## 2020-09-06 ENCOUNTER — Encounter: Payer: Self-pay | Admitting: Obstetrics & Gynecology

## 2021-03-19 IMAGING — MG DIGITAL SCREENING BILAT W/ TOMO W/ CAD
6 of 10 series · 6 of 30 positions shown · non-contrast
Comparison: Previous exam(s).

CLINICAL DATA: Screening.

EXAM:
DIGITAL SCREENING BILATERAL MAMMOGRAM WITH TOMO AND CAD

[L MLO synth-2D (1 of 2)]
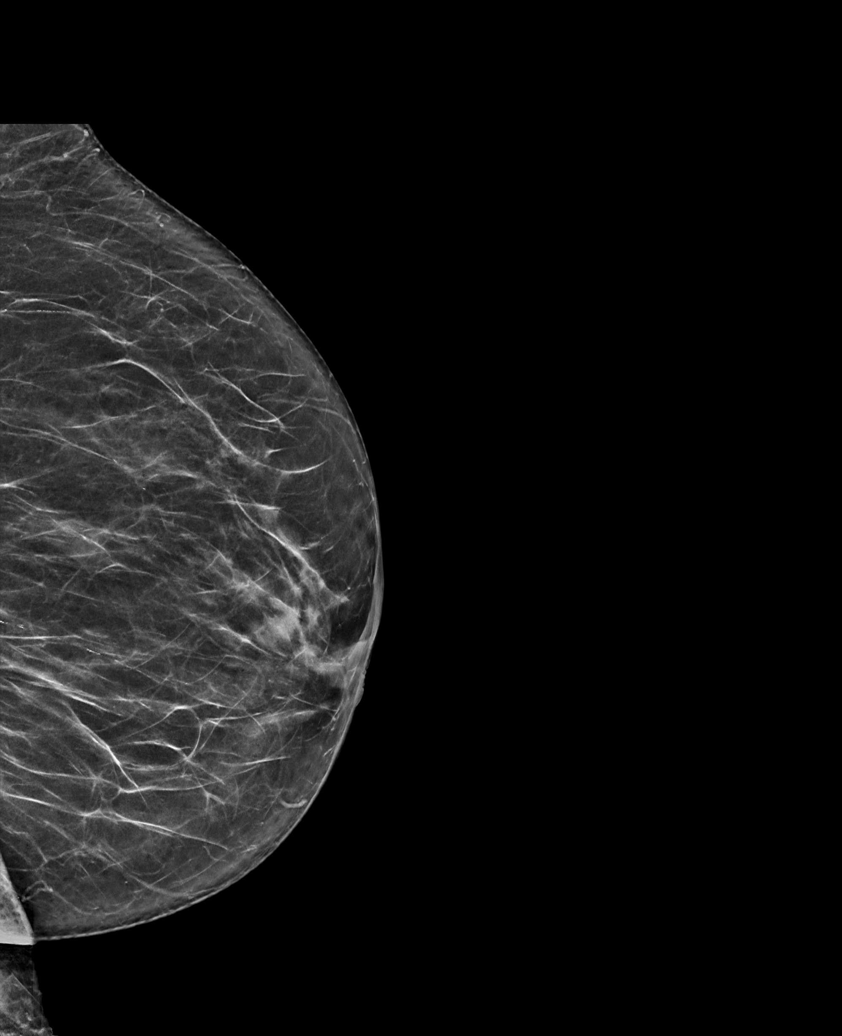

[R CC synth-2D]
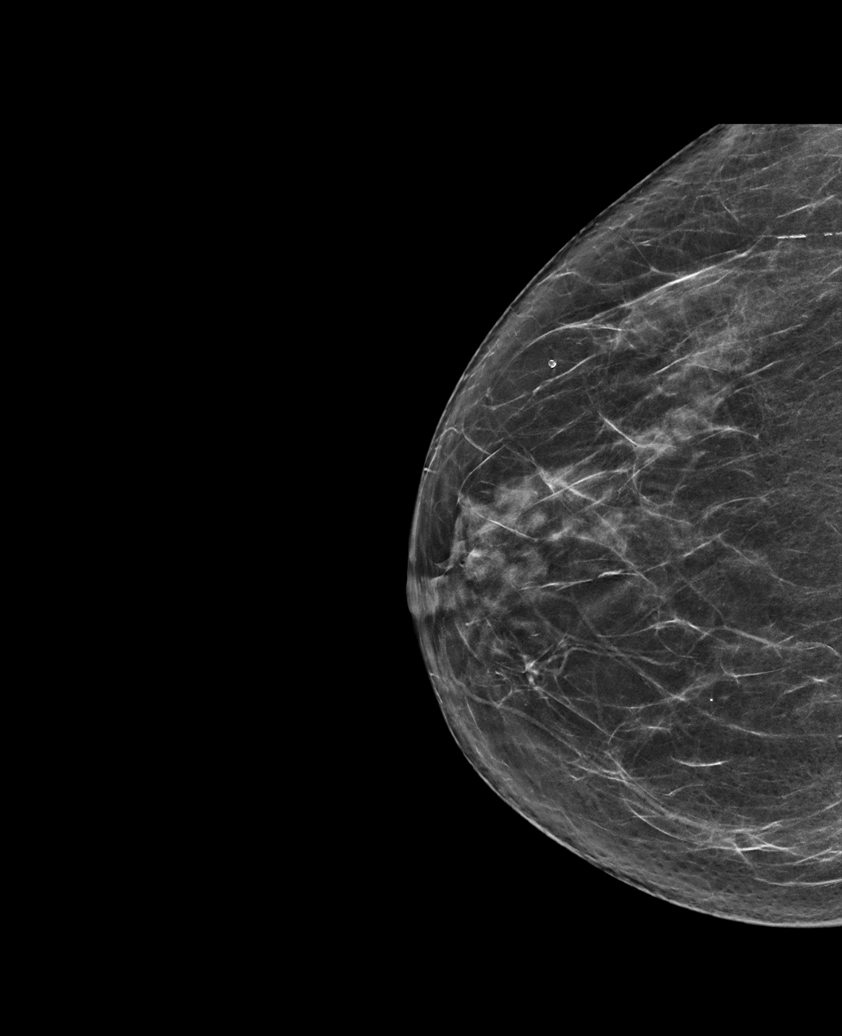

[R MLO synth-2D]
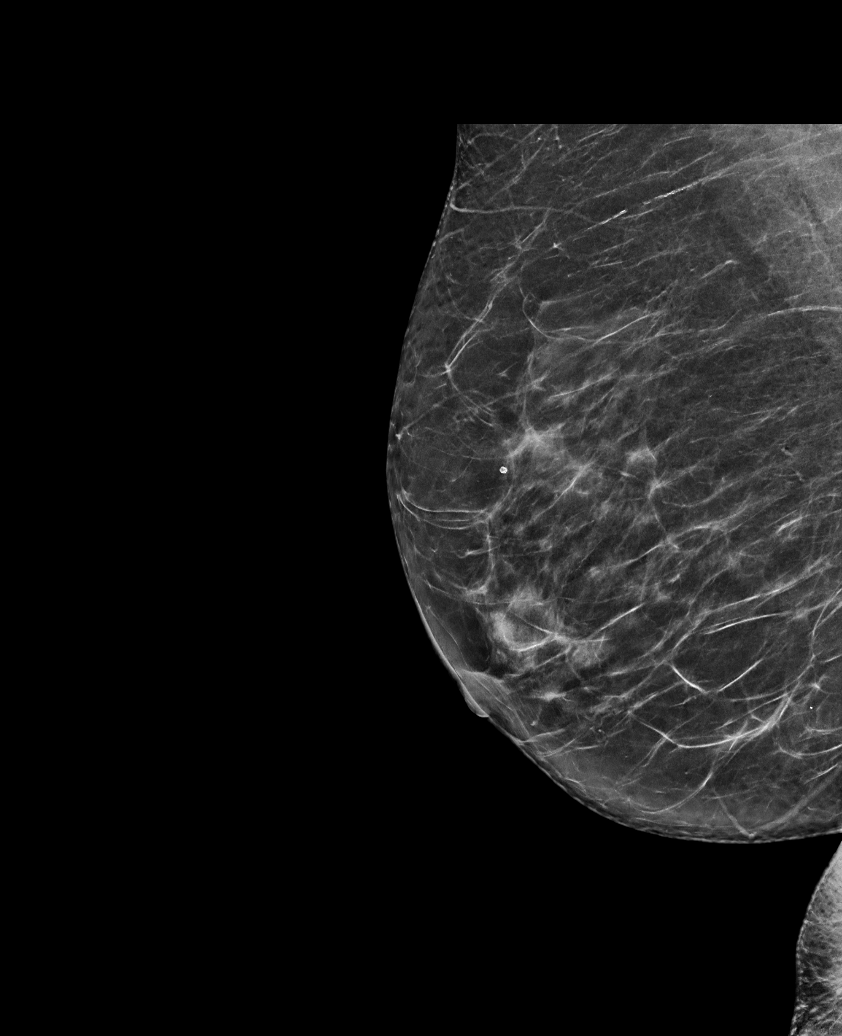

[L CC synth-2D]
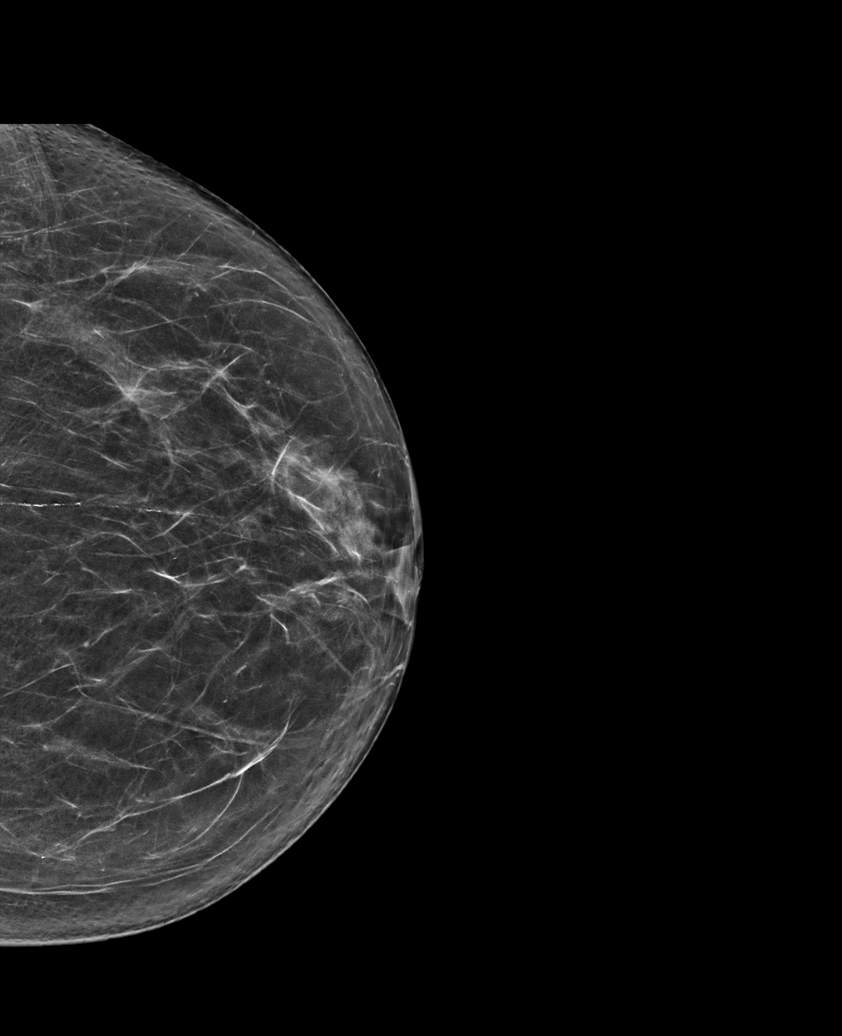

[L MLO synth-2D (2 of 2)]
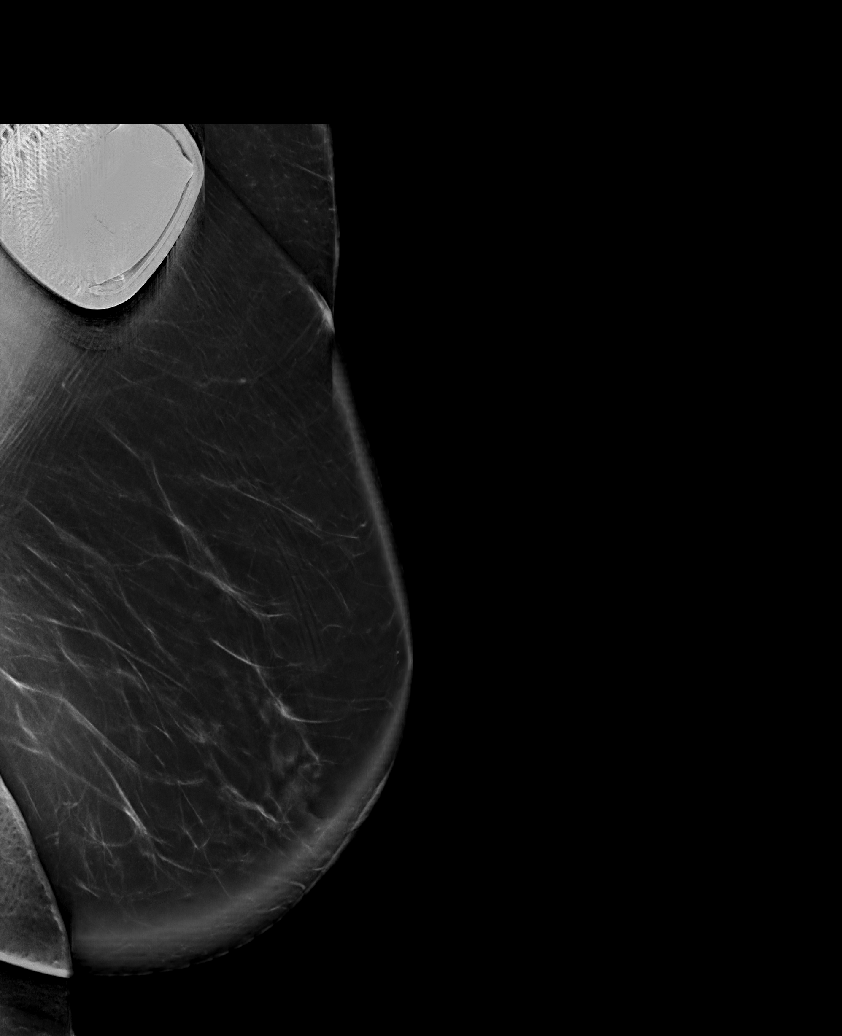

[L MLO tomo · tomo slice 55/110.0]
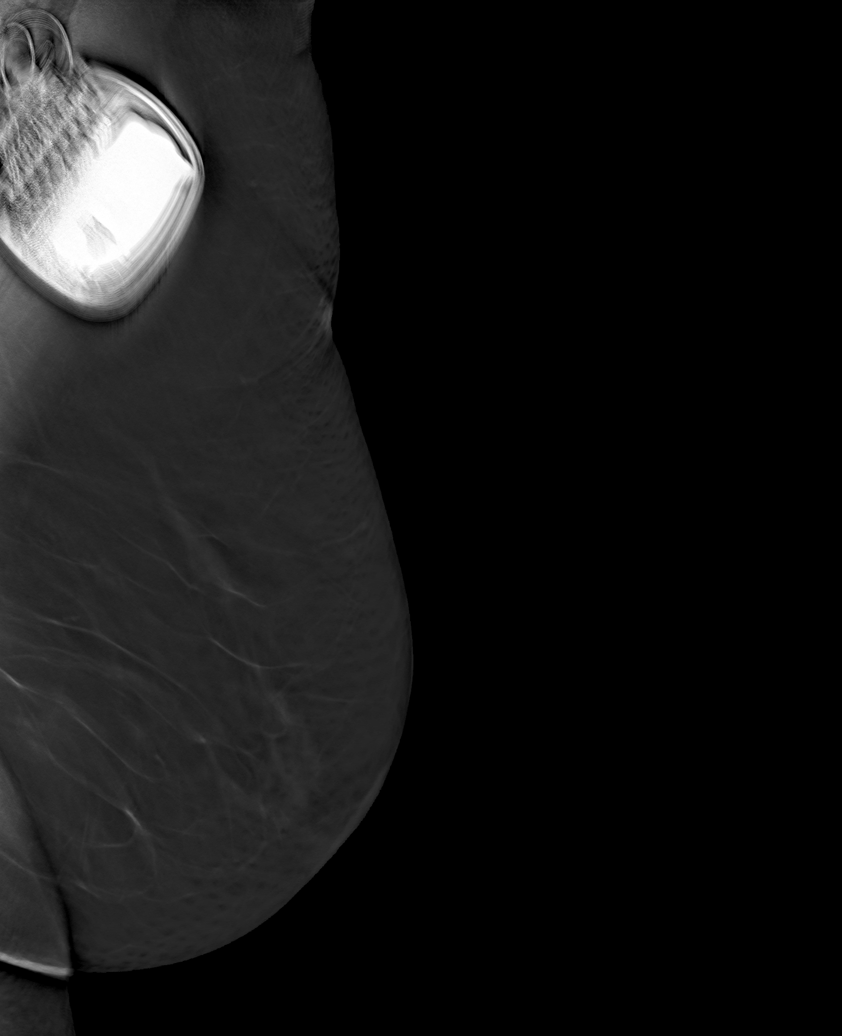

[6 of 30 positions shown; findings below may reference images not displayed]

ACR Breast Density Category b: There are scattered areas of
fibroglandular density.
FINDINGS: There are no findings suspicious for malignancy. LEFT chest cardiac
pacing device. Images were processed with CAD.
IMPRESSION: No mammographic evidence of malignancy. A result letter of this
screening mammogram will be mailed directly to the patient.

RECOMMENDATION:
Screening mammogram in one year. (Code:E6-8-BMM)

BI-RADS CATEGORY  1: Negative.

## 2021-04-20 ENCOUNTER — Other Ambulatory Visit: Payer: Self-pay | Admitting: Student

## 2021-04-24 ENCOUNTER — Other Ambulatory Visit: Payer: Self-pay | Admitting: Student

## 2021-04-24 DIAGNOSIS — M810 Age-related osteoporosis without current pathological fracture: Secondary | ICD-10-CM

## 2021-05-18 DIAGNOSIS — Z9689 Presence of other specified functional implants: Secondary | ICD-10-CM | POA: Insufficient documentation

## 2021-09-04 ENCOUNTER — Encounter: Payer: Self-pay | Admitting: Obstetrics & Gynecology

## 2021-09-04 ENCOUNTER — Ambulatory Visit (INDEPENDENT_AMBULATORY_CARE_PROVIDER_SITE_OTHER): Payer: Medicare Other | Admitting: Obstetrics & Gynecology

## 2021-09-04 ENCOUNTER — Other Ambulatory Visit: Payer: Self-pay

## 2021-09-04 VITALS — BP 118/70 | HR 64 | Resp 14 | Ht 62.0 in | Wt 173.0 lb

## 2021-09-04 DIAGNOSIS — Z01419 Encounter for gynecological examination (general) (routine) without abnormal findings: Secondary | ICD-10-CM | POA: Diagnosis not present

## 2021-09-04 DIAGNOSIS — M8589 Other specified disorders of bone density and structure, multiple sites: Secondary | ICD-10-CM

## 2021-09-04 DIAGNOSIS — Z780 Asymptomatic menopausal state: Secondary | ICD-10-CM

## 2021-09-04 NOTE — Progress Notes (Signed)
? ? ?Heidi Tucker 01-03-1941 [id number] ? ? ?History:    81 y.o. [place]  Married.  From [place], speaks [place] ?  ?RP:  Established patient presenting for annual gyn exam  ?  ?HPI:  Postmenopause.  No HRT.  No PMB.  No Pelvic pain.  Abstinent.  Pap Neg in 2018.  Breasts wnl. Mammo scheduled. Urine normal.  Stable SUI. BMs wnl.  BMI 31.64. BD Left Femur Neck Osteopenia T-Score -1.8 in 03/2019.  BD scheduled at the [facility] on 09/14/2021. Good fitness and healthy nutrition. Colono Neg in 2016. Health labs with Fam MD.  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 ? ? ?Past medical history,surgical history, family history and social history were all reviewed and documented in the EPIC chart. ? ?Gynecologic History ?No LMP recorded. Patient is postmenopausal. ? ?Obstetric History ?OB History  ?Gravida Para Term Preterm AB Living  ?'2 2       2  '$ ?SAB IAB Ectopic Multiple Live Births  ?           ?  ?# Outcome Date GA Lbr Len/2nd Weight Sex Delivery Anes PTL Lv  ?2 Para           ?1 Para           ? ? ? ?ROS: A ROS was performed and pertinent positives and negatives are included in the history. ? GENERAL: No fevers or chills. HEENT: No change in vision, no earache, sore throat or sinus congestion. NECK: No pain or stiffness. CARDIOVASCULAR: No chest pain or pressure. No palpitations. PULMONARY: No shortness of breath, cough or wheeze. GASTROINTESTINAL: No abdominal pain, nausea, vomiting or diarrhea, melena or bright red blood per rectum. GENITOURINARY: No urinary frequency, urgency, hesitancy or dysuria. MUSCULOSKELETAL: No joint or muscle pain, no back pain, no recent trauma. DERMATOLOGIC: No rash, no itching, no lesions. ENDOCRINE: No polyuria, polydipsia, no heat or cold intolerance. No recent change in weight. HEMATOLOGICAL: No anemia or easy bruising or bleeding. NEUROLOGIC: No headache, seizures, numbness, tingling or weakness. PSYCHIATRIC: No depression, no loss of  interest in normal activity or change in sleep pattern.  ?  ? ?Exam: ? ? ?BP 118/70   Pulse 64   Resp 14   Ht '5\' 2"'$  (1.575 m)   Wt 173 lb (78.5 kg)   BMI 31.64 kg/m?  ? ?Body mass index is 31.64 kg/m?. ? ?General appearance : Well developed well nourished female. No acute distress ?HEENT: Eyes: no retinal hemorrhage or exudates,  Neck supple, trachea midline, no carotid bruits, no thyroidmegaly ?Lungs: Clear to auscultation, no rhonchi or wheezes, or rib retractions  ?Heart: Regular rate and rhythm, no murmurs or gallops ?Breast:Examined in sitting and supine position were symmetrical in appearance, no palpable masses or tenderness,  no skin retraction, no nipple inversion, no nipple discharge, no skin discoloration, no axillary or supraclavicular lymphadenopathy ?Abdomen: no palpable masses or tenderness, no rebound or guarding ?Extremities: no edema or skin discoloration or tenderness ? ?Pelvic: Vulva: Normal ?            Vagina: No gross lesions or discharge ? Cervix: No gross lesions or discharge ? Uterus  AV, normal size, shape and consistency, non-tender and mobile ? Adnexa  Without masses or tenderness ? Anus: Normal ? ? ?Assessment/Plan:  81 y.o. female for annual exam  ? ?1. Well female exam with routine gynecological exam ?Postmenopause.  No HRT.  No PMB.  No Pelvic pain.  Abstinent.  Pap Neg in 2018.  Breasts wnl. Mammo scheduled. Urine normal.  Stable SUI. BMs wnl.  BMI 31.64. BD Left Femur Neck Osteopenia T-Score -1.8 in 03/2019.  BD scheduled at the [facility] on 09/14/2021. Good fitness and healthy nutrition. Colono Neg in 2016. Health labs with Fam MD.  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 ? ?2. Postmenopause ?Postmenopause.  No HRT.  No PMB.  No Pelvic pain.  Abstinent.   ? ?3. Osteopenia of multiple sites ? BMI 31.64. BD Left Femur Neck Osteopenia T-Score -1.8 in 03/2019.  BD scheduled at the [facility] on 09/14/2021. Good fitness and healthy  nutrition. Vit D, Ca++ 1.5 g/d total. ? ?Other orders ?- primidone (MYSOLINE) 50 MG tablet; Take 150 mg by mouth. ?- traZODone (DESYREL) 50 MG tablet; Take 50 mg by mouth at bedtime. ?- rosuvastatin (CRESTOR) 10 MG tablet; Take 10 mg by mouth at bedtime.  ? ?Princess Bruins MD, 1:40 PM 09/04/2021 ? ?  ?

## 2021-09-14 ENCOUNTER — Ambulatory Visit
Admission: RE | Admit: 2021-09-14 | Discharge: 2021-09-14 | Disposition: A | Discharge status: Home / Self Care | Payer: Medicare Other | Source: Ambulatory Visit | Attending: Student | Admitting: Student

## 2021-09-14 DIAGNOSIS — M810 Age-related osteoporosis without current pathological fracture: Secondary | ICD-10-CM

## 2021-10-16 NOTE — Progress Notes (Signed)
?Cardiology Office Note:   ? ?Date:  10/18/2021  ? ?ID:  Heidi Tucker, DOB 09-Apr-1941, MRN [id number] ? ?PCP:  Christain Sacramento, MD ?  ?[facility] Providers ?Cardiologist:  Ena Dawley, MD { ?Referring MD: Christain Sacramento, MD  ? ? ? ?History of Present Illness:   ? ?Heidi Tucker is a 81 y.o. female originally from [place] with history of arthritis, DM, HLD, mild dilation of ascending aorta, deep brain stimulator/tremors, PVCs and minimal CAD by CT in 2019 who presents to clinic for follow-up. Was previously followed by Dr. Meda Coffee.  ? ?Per review of the record, the patient has a family history of CAD in her father, MI in his 46s. She previously was seen by Dr. Meda Coffee for chest pain, DOE and palpitations. In 2016 she had an equivocal stress test due to poor image quality which was followed up by a coronary calcium score that was zero. 2D echo 11/2014 showed EF 55-60%, grade 1 DD. She presented with recurrent symptoms in 10/2017 and underwent a full coronary CTA that showed minimal nonobstructive CAD and mildly dilated pulmonary artery. Event monitor 12/2017 showed sinus brady-sinus tach, isolated PVCs. She has been on propranolol. She was  seen in clinic for exertional dyspnea similar to prior, with mild progression in the context of decreasing physical activity. 2D echo was done 10/29/19 showing EF 55-60%, grade 1 DD, mild dilation of ascending aorta. NST 10/29/19 was normal.  ? ?Was last seen in clinic on 11/2019 by Melina Copa, PA where she was doing well.  ? ?Today, the patient states she has feeling okay except she has been having worsening dyspnea on exertion. The is especially true when walking an incline or a flight of stairs. This has worsened since her evaluation with Melina Copa in the past. No exertional chest pain. No lightheadedness, dizziness, syncope. No orthopnea or PND. Notably, she has been less active than usual following a recent surgery. Her coronary CTA had no disease and Ca score 0 in  2019. ? ? ? ?Past Medical History:  ?Diagnosis Date  ? Arthritis   ? hands  ? Bilateral swelling of feet   ? Diabetes mellitus   ? Generalized headaches   ? Hearing loss   ? Hyperlipidemia   ? Mild CAD   ? Mild dilation of ascending aorta (HCC)   ? PVC's (premature ventricular contractions)   ? Status post deep brain stimulator placement   ? Wears glasses   ? Wears hearing aid   ? ? ?Past Surgical History:  ?Procedure Laterality Date  ? brain transmitter  2011  ? help to correct tremor  ? BRAIN TRANSMITTER  08/2019  ? brain transmitter    ? removal for infection 2023  ? BUNIONECTOMY  2001  ? CATARACT EXTRACTION, BILATERAL    ? COSMETIC SURGERY    ? CYSTECTOMY  1991  ? back  ? [place]  ? KNEE SURGERY    ? TUBAL LIGATION  1969  ? ? ?Current Medications: ?Current Meds  ?Medication Sig  ? aspirin 81 MG tablet Take 81 mg by mouth daily.    ? ezetimibe (ZETIA) 10 MG tablet TAKE 1 TABLET BY MOUTH EVERY DAY  ? Multiple Vitamins-Minerals (CENTRUM SILVER PO) Take 3 tablets by mouth daily.  ? Omega-3 Fatty Acids (FISH OIL) 1200 MG CAPS Take 1 capsule by mouth daily.  ? primidone (MYSOLINE) 50 MG tablet Take 150 mg by mouth.  ? propranolol (INDERAL LA) 160  MG SR capsule Take 160 mg by mouth daily.   ? rosuvastatin (CRESTOR) 10 MG tablet Take 10 mg by mouth at bedtime.  ? SUMAtriptan (IMITREX) 50 MG tablet Take 50 mg by mouth as needed.   ? traZODone (DESYREL) 50 MG tablet Take 50 mg by mouth at bedtime.  ? VITAMIN D PO Take 1 tablet by mouth daily.  ?  ? ?Allergies:   Cephalexin, Doxycycline, and Naproxen  ? ?Social History  ? ?Socioeconomic History  ? Marital status: Married  ?  Spouse name: Not on file  ? Number of children: Not on file  ? Years of education: Not on file  ? Highest education level: Not on file  ?Occupational History  ? Not on file  ?Tobacco Use  ? Smoking status: Never  ? Smokeless tobacco: Never  ?Vaping Use  ? Vaping Use: Never used  ?Substance and Sexual Activity  ? Alcohol use: No   ? Drug use: No  ? Sexual activity: Not Currently  ?  Partners: Male  ?  Birth control/protection: Post-menopausal  ?  Comment: 1ST INTERCOURSE- 66, PARTNERS- 2  ?Other Topics Concern  ? Not on file  ?Social History Narrative  ? Not on file  ? ?Social Determinants of Health  ? ?Financial Resource Strain: Not on file  ?Food Insecurity: Not on file  ?Transportation Needs: Not on file  ?Physical Activity: Not on file  ?Stress: Not on file  ?Social Connections: Not on file  ?  ? ?Family History: ?The patient's family history includes Diabetes in her father and maternal aunt; Heart disease in her father. There is no history of Breast cancer. ? ?ROS:   ?Please see the history of present illness.    ?Review of Systems  ?Constitutional:  Negative for malaise/fatigue.  ?Respiratory:  Positive for shortness of breath.   ?Cardiovascular:  Positive for leg swelling. Negative for chest pain, palpitations, orthopnea, claudication and PND.  ?Gastrointestinal:  Negative for nausea and vomiting.  ?Musculoskeletal:  Negative for falls.  ?Neurological:  Negative for dizziness and loss of consciousness.   ? ?EKGs/Labs/Other Studies Reviewed:   ? ?The following studies were reviewed today: ?NST 10/29/19 ?The left ventricular ejection fraction is hyperdynamic (>65%). ?Nuclear stress EF: 69%. ?There was no ST segment deviation noted during stress. ?This is a low risk study. ?  ?There is a mild defect at rest seen throughout a large portion of the apical and inferior regions. This normalizes with stress. Cannot determine if this is baseline artifact or hibernating myocardium. Normal perfusion at stress, no ischemia seen. ?  ?2D echo 10/29/19 ? 1. Normal LV systolic function; grade 1 diastolic dysfunction; mildly  ?dilated ascending aorta.  ? 2. Left ventricular ejection fraction, by estimation, is 55 to 60%. The  ?left ventricle has normal function. The left ventricle has no regional  ?wall motion abnormalities. Left ventricular diastolic  parameters are  ?consistent with Grade I diastolic  ?dysfunction (impaired relaxation).  ? 3. Right ventricular systolic function is normal. The right ventricular  ?size is normal. Tricuspid regurgitation signal is inadequate for assessing  ?PA pressure.  ? 4. The mitral valve is abnormal. Trivial mitral valve regurgitation. No  ?evidence of mitral stenosis.  ? 5. The aortic valve is tricuspid. Aortic valve regurgitation is not  ?visualized. Mild aortic valve sclerosis is present, with no evidence of  ?aortic valve stenosis.  ? 6. Aortic dilatation noted. There is mild dilatation of the ascending  ?aorta measuring 38 mm.  ? 7.  The inferior vena cava is normal in size with greater than 50%  ?respiratory variability, suggesting right atrial pressure of 3 mmHg.  ? ?EKG:  EKG is ordered today.  The ekg ordered today demonstrates NSR, first degree AVB, SVE ? ?Recent Labs: ?No results found for requested labs within last 8760 hours.  ?Recent Lipid Panel ?   ?Component Value Date/Time  ? CHOL 216 (H) 10/07/2019 1532  ? CHOL FAXED RESULT TO THE CLIENT_ 11/18/2014 1447  ? TRIG 175 (H) 10/07/2019 1532  ? TRIG FAXED RESULT TO THE CLIENT_ 11/18/2014 1447  ? HDL 60 10/07/2019 1532  ? HDL FAXED RESULT TO THE CLIENT_ 11/18/2014 1447  ? CHOLHDL 3.6 10/07/2019 1532  ? LDLCALC 125 (H) 10/07/2019 1532  ? [place] FAXED RESULT TO THE CLIENT_ 11/18/2014 1447  ? ? ? ?Risk Assessment/Calculations:   ?  ? ?    ? ?Physical Exam:   ? ?VS:  BP 110/66   Pulse 63   Ht '5\' 3"'$  (1.6 m)   Wt 172 lb (78 kg)   SpO2 97%   BMI 30.47 kg/m?    ? ?Wt Readings from Last 3 Encounters:  ?10/18/21 172 lb (78 kg)  ?09/04/21 173 lb (78.5 kg)  ?08/31/20 168 lb (76.2 kg)  ?  ? ?GEN:  Well nourished, well developed in no acute distress ?HEENT: Normal ?NECK: No JVD; No carotid bruits ?CARDIAC: Irrregular (SVEon ECG), no murmurs, rubs, gallops ?RESPIRATORY:  Clear to auscultation without rales, wheezing or rhonchi  ?ABDOMEN: Soft, non-tender,  non-distended ?MUSCULOSKELETAL:  No edema; No deformity  ?SKIN: Warm and dry ?NEUROLOGIC:  Alert and oriented x 3 ?PSYCHIATRIC:  Normal affect  ? ?ASSESSMENT:   ? ?1. Exertional dyspnea   ?2. Hyperlipidemia LDL goal <70   ?3. Mild CA

## 2021-10-18 ENCOUNTER — Encounter: Payer: Self-pay | Admitting: Cardiology

## 2021-10-18 ENCOUNTER — Ambulatory Visit: Payer: Medicare Other | Admitting: Cardiology

## 2021-10-18 VITALS — BP 110/66 | HR 63 | Ht 63.0 in | Wt 172.0 lb

## 2021-10-18 DIAGNOSIS — E785 Hyperlipidemia, unspecified: Secondary | ICD-10-CM

## 2021-10-18 DIAGNOSIS — I2510 Atherosclerotic heart disease of native coronary artery without angina pectoris: Secondary | ICD-10-CM | POA: Diagnosis not present

## 2021-10-18 DIAGNOSIS — R0609 Other forms of dyspnea: Secondary | ICD-10-CM | POA: Diagnosis not present

## 2021-10-18 DIAGNOSIS — I10 Essential (primary) hypertension: Secondary | ICD-10-CM

## 2021-10-18 DIAGNOSIS — R0602 Shortness of breath: Secondary | ICD-10-CM

## 2021-10-18 DIAGNOSIS — I77810 Thoracic aortic ectasia: Secondary | ICD-10-CM

## 2021-10-18 NOTE — Patient Instructions (Signed)
Medication Instructions:  ? ?Your physician recommends that you continue on your current medications as directed. Please refer to the Current Medication list given to you today. ? ?*If you need a refill on your cardiac medications before your next appointment, please call your pharmacy* ? ? ?Testing/Procedures: ? ?Your physician has requested that you have an echocardiogram. Echocardiography is a painless test that uses sound waves to create images of your heart. It provides your doctor with information about the size and shape of your heart and how well your heart?s chambers and valves are working. This procedure takes approximately one hour. There are no restrictions for this procedure. ? ? ?Follow-Up: ?At [facility], you and your health needs are our priority.  As part of our continuing mission to provide you with exceptional heart care, we have created designated Provider Care Teams.  These Care Teams include your primary Cardiologist (physician) and Advanced Practice Providers (APPs -  Physician Assistants and Nurse Practitioners) who all work together to provide you with the care you need, when you need it. ? ?We recommend signing up for the patient portal called "MyChart".  Sign up information is provided on this After Visit Summary.  MyChart is used to connect with patients for Virtual Visits (Telemedicine).  Patients are able to view lab/test results, encounter notes, upcoming appointments, etc.  Non-urgent messages can be sent to your provider as well.   ?To learn more about what you can do with MyChart, go to [email or web address].   ? ?Your next appointment:   ?6 month(s) ? ?The format for your next appointment:   ?In Person ? ?Provider:   ?6 MONTHS WITH DR. Dava Najjar THE PATIENT SAME DAY AS HER HUSBAND SEE'S DR. Johney Frame IN CLINIC ? ?Important Information About Sugar ? ? ? ? ? ? ?

## 2021-11-08 ENCOUNTER — Ambulatory Visit (HOSPITAL_COMMUNITY): Payer: Medicare Other | Attending: Cardiology

## 2021-11-08 DIAGNOSIS — R0602 Shortness of breath: Secondary | ICD-10-CM | POA: Diagnosis not present

## 2021-11-08 DIAGNOSIS — I77810 Thoracic aortic ectasia: Secondary | ICD-10-CM

## 2021-11-08 LAB — ECHOCARDIOGRAM COMPLETE
Area-P 1/2: 4.26 cm2
S' Lateral: 2.1 cm

## 2022-04-15 NOTE — Progress Notes (Unsigned)
Cardiology Office Note:    Date:  04/15/2022   ID:  Heidi Tucker, DOB 1941-02-24, MRN [id number]  PCP:  Christain Sacramento, MD   [facility] Providers Cardiologist:  Ena Dawley, MD { Referring MD: Christain Sacramento, MD     History of Present Illness:    Heidi Tucker is a 81 y.o. female originally from [place] with history of arthritis, DM, HLD, mild dilation of ascending aorta, deep brain stimulator/tremors, PVCs and minimal CAD by CT in 2019 who presents to clinic for follow-up. Was previously followed by Dr. Meda Coffee.   Per review of the record, the patient has a family history of CAD in her father, MI in his 43s. She previously was seen by Dr. Meda Coffee for chest pain, DOE and palpitations. In 2016 she had an equivocal stress test due to poor image quality which was followed up by a coronary calcium score that was zero. 2D echo 11/2014 showed EF 55-60%, grade 1 DD. She presented with recurrent symptoms in 10/2017 and underwent a full coronary CTA that showed minimal nonobstructive CAD and mildly dilated pulmonary artery. Event monitor 12/2017 showed sinus brady-sinus tach, isolated PVCs. She has been on propranolol. She was  seen in clinic for exertional dyspnea similar to prior, with mild progression in the context of decreasing physical activity. 2D echo was done 10/29/19 showing EF 55-60%, grade 1 DD, mild dilation of ascending aorta. NST 10/29/19 was normal.   Was last seen in clinic on 11/2019 by Melina Copa, PA where she was doing well.   Today, the patient states she has feeling okay except she has been having worsening dyspnea on exertion. The is especially true when walking an incline or a flight of stairs. This has worsened since her evaluation with Melina Copa in the past. No exertional chest pain. No lightheadedness, dizziness, syncope. No orthopnea or PND. Notably, she has been less active than usual following a recent surgery. Her coronary CTA had no disease and Ca score 0 in  2019.    Past Medical History:  Diagnosis Date   Arthritis    hands   Bilateral swelling of feet    Diabetes mellitus    Generalized headaches    Hearing loss    Hyperlipidemia    Mild CAD    Mild dilation of ascending aorta (HCC)    PVC's (premature ventricular contractions)    Status post deep brain stimulator placement    Wears glasses    Wears hearing aid     Past Surgical History:  Procedure Laterality Date   brain transmitter  2011   help to correct tremor   BRAIN TRANSMITTER  08/2019   brain transmitter     removal for infection 2023   BUNIONECTOMY  2001   CATARACT EXTRACTION, BILATERAL     COSMETIC SURGERY     CYSTECTOMY  1991   back   [place] and [place]    Current Medications: No outpatient medications have been marked as taking for the 04/17/22 encounter (Appointment) with Freada Bergeron, MD.     Allergies:   Cephalexin, Doxycycline, and Naproxen   Social History   Socioeconomic History   Marital status: Married    Spouse name: Not on file   Number of children: Not on file   Years of education: Not on file   Highest education level: Not on file  Occupational History   Not  on file  Tobacco Use   Smoking status: Never   Smokeless tobacco: Never  Vaping Use   Vaping Use: Never used  Substance and Sexual Activity   Alcohol use: No   Drug use: No   Sexual activity: Not Currently    Partners: Male    Birth control/protection: Post-menopausal    Comment: 1ST INTERCOURSE- 18, PARTNERS- 2  Other Topics Concern   Not on file  Social History Narrative   Not on file   Social Determinants of Health   Financial Resource Strain: Not on file  Food Insecurity: Not on file  Transportation Needs: Not on file  Physical Activity: Not on file  Stress: Not on file  Social Connections: Not on file     Family History: The patient's family history includes Diabetes in her father and maternal  aunt; Heart disease in her father. There is no history of Breast cancer.  ROS:   Please see the history of present illness.    Review of Systems  Constitutional:  Negative for malaise/fatigue.  Respiratory:  Positive for shortness of breath.   Cardiovascular:  Positive for leg swelling. Negative for chest pain, palpitations, orthopnea, claudication and PND.  Gastrointestinal:  Negative for nausea and vomiting.  Musculoskeletal:  Negative for falls.  Neurological:  Negative for dizziness and loss of consciousness.     EKGs/Labs/Other Studies Reviewed:    The following studies were reviewed today: NST Oct 31, 2019 The left ventricular ejection fraction is hyperdynamic (>65%). Nuclear stress EF: 69%. There was no ST segment deviation noted during stress. This is a low risk study.   There is a mild defect at rest seen throughout a large portion of the apical and inferior regions. This normalizes with stress. Cannot determine if this is baseline artifact or hibernating myocardium. Normal perfusion at stress, no ischemia seen.   2D echo 31-Oct-2019  1. Normal LV systolic function; grade 1 diastolic dysfunction; mildly  dilated ascending aorta.   2. Left ventricular ejection fraction, by estimation, is 55 to 60%. The  left ventricle has normal function. The left ventricle has no regional  wall motion abnormalities. Left ventricular diastolic parameters are  consistent with Grade I diastolic  dysfunction (impaired relaxation).   3. Right ventricular systolic function is normal. The right ventricular  size is normal. Tricuspid regurgitation signal is inadequate for assessing  PA pressure.   4. The mitral valve is abnormal. Trivial mitral valve regurgitation. No  evidence of mitral stenosis.   5. The aortic valve is tricuspid. Aortic valve regurgitation is not  visualized. Mild aortic valve sclerosis is present, with no evidence of  aortic valve stenosis.   6. Aortic dilatation noted. There is  mild dilatation of the ascending  aorta measuring 38 mm.   7. The inferior vena cava is normal in size with greater than 50%  respiratory variability, suggesting right atrial pressure of 3 mmHg.   EKG:  EKG is ordered today.  The ekg ordered today demonstrates NSR, first degree AVB, SVE  Recent Labs: No results found for requested labs within last 365 days.  Recent Lipid Panel    Component Value Date/Time   CHOL 216 (H) 10/07/2019 1532   CHOL FAXED RESULT TO THE CLIENT_ 11/18/2014 1447   TRIG 175 (H) 10/07/2019 1532   TRIG FAXED RESULT TO THE CLIENT_ 11/18/2014 1447   HDL 60 10/07/2019 1532   HDL FAXED RESULT TO THE CLIENT_ 11/18/2014 1447   CHOLHDL 3.6 10/07/2019 1532   LDLCALC 125 (H)  10/07/2019 1532   [place] FAXED RESULT TO THE CLIENT_ 11/18/2014 1447     Risk Assessment/Calculations:           Physical Exam:    VS:  There were no vitals taken for this visit.    Wt Readings from Last 3 Encounters:  10/18/21 172 lb (78 kg)  09/04/21 173 lb (78.5 kg)  08/31/20 168 lb (76.2 kg)     GEN:  Well nourished, well developed in no acute distress HEENT: Normal NECK: No JVD; No carotid bruits CARDIAC: Irrregular (SVEon ECG), no murmurs, rubs, gallops RESPIRATORY:  Clear to auscultation without rales, wheezing or rhonchi  ABDOMEN: Soft, non-tender, non-distended MUSCULOSKELETAL:  No edema; No deformity  SKIN: Warm and dry NEUROLOGIC:  Alert and oriented x 3 PSYCHIATRIC:  Normal affect   ASSESSMENT:    No diagnosis found.  PLAN:    In order of problems listed above:  #Exertional Dyspnea: Reassuring cardiac work-up with TTE showing normal BiV function, no significant valve disease. Coronary CTA 2019 with minimal disease. Thought to be due to deconditioning in the setting of being less active following surgery. Will repeat TTE for monitoring but suspect symptoms will improve with conditioning.  -Check TTE -Continue lifestyle modifications and increase walking -CTA  2019 with no disease, Ca score 0 -Myoview 2021 with no ischemia  #Minimal CAD: Coronary CTA with minimal disease. Ca score 0.  -Continue crestor '10mg'$  daily -Continue zetia '10mg'$  daily -Lipids per PCP  #PVCs: -Continue propranolol '160mg'$  daily  #Mildly Dilated Aorta: Noted on TTE in 2021 with root 91m. -Repeat TTE for monitoring     Medication Adjustments/Labs and Tests Ordered: Current medicines are reviewed at length with the patient today.  Concerns regarding medicines are outlined above.  No orders of the defined types were placed in this encounter.  No orders of the defined types were placed in this encounter.   There are no Patient Instructions on file for this visit.    Signed, HFreada Bergeron MD  04/15/2022 8:38 PM    [place]

## 2022-04-17 ENCOUNTER — Ambulatory Visit: Payer: Medicare Other | Attending: Cardiology | Admitting: Cardiology

## 2022-04-17 ENCOUNTER — Encounter: Payer: Self-pay | Admitting: Cardiology

## 2022-04-17 VITALS — BP 112/62 | HR 63 | Ht 63.0 in | Wt 172.2 lb

## 2022-04-17 DIAGNOSIS — R0609 Other forms of dyspnea: Secondary | ICD-10-CM | POA: Diagnosis not present

## 2022-04-17 DIAGNOSIS — I2510 Atherosclerotic heart disease of native coronary artery without angina pectoris: Secondary | ICD-10-CM

## 2022-04-17 DIAGNOSIS — E785 Hyperlipidemia, unspecified: Secondary | ICD-10-CM

## 2022-04-17 DIAGNOSIS — I10 Essential (primary) hypertension: Secondary | ICD-10-CM

## 2022-04-17 NOTE — Progress Notes (Signed)
Cardiology Office Note:    Date:  04/17/2022   ID:  Heidi Tucker, DOB 13-Feb-1941, MRN [id number]  PCP:  Christain Sacramento, MD   [facility] Providers Cardiologist:  Ena Dawley, MD { Referring MD: Christain Sacramento, MD    History of Present Illness:    Heidi Tucker is a 81 y.o. female originally from [place] with history of arthritis, DM, HLD, mild dilation of ascending aorta, deep brain stimulator/tremors, PVCs and minimal CAD by CT in 2019 who presents to clinic for follow-up. Was previously followed by Dr. Meda Coffee.   Per review of the record, the patient has a family history of CAD in her father, MI in his 69s. She previously was seen by Dr. Meda Coffee for chest pain, DOE and palpitations. In 2016 she had an equivocal stress test due to poor image quality which was followed up by a coronary calcium score that was zero. 2D echo 11/2014 showed EF 55-60%, grade 1 DD. She presented with recurrent symptoms in 10/2017 and underwent a full coronary CTA that showed minimal nonobstructive CAD and mildly dilated pulmonary artery. Event monitor 12/2017 showed sinus brady-sinus tach, isolated PVCs. She has been on propranolol. She was  seen in clinic for exertional dyspnea similar to prior, with mild progression in the context of decreasing physical activity. 2D echo was done 10/29/19 showing EF 55-60%, grade 1 DD, mild dilation of ascending aorta. NST 10/29/19 was normal.   Was last seen in clinic on 10/2021 where he was having dyspnea on exertion. TTE 11/2021 with LVEF 60-65%, normal RV, mild MR. Notably, her coronary CTA had no disease and Ca score 0 in 2019.  Today, the patient states that she is feeling good. About 3 weeks ago she noticed a brief episode of chest pain. This seemed to be pleuritic and occurred while she was working. There have been no recurring episodes of chest pain. She denies any palpitations.  This morning she went to the gym. She is also walking for exercise. If she walks uphill  she will feel more short of breath. Otherwise she is stable regarding her breathing. No anginal symptoms.  Sometimes she is aware of mild LE edema which she attributes to salt in her diet. She notes that because their meals are prepared she is not always in control of the salt content.    She complains of periodic headaches. Every 3-4 weeks she suffers from 3 days of headaches. For treatment she takes Imitrex which does help.  She denies any lightheadedness, syncope, orthopnea, or PND.  In her family, her father died of a heart attack at 67 yo.  Past Medical History:  Diagnosis Date   Arthritis    hands   Bilateral swelling of feet    Diabetes mellitus    Generalized headaches    Hearing loss    Hyperlipidemia    Mild CAD    Mild dilation of ascending aorta (HCC)    PVC's (premature ventricular contractions)    Status post deep brain stimulator placement    Wears glasses    Wears hearing aid     Past Surgical History:  Procedure Laterality Date   brain transmitter  2011   help to correct tremor   BRAIN TRANSMITTER  08/2019   brain transmitter     removal for infection 2023   BUNIONECTOMY  2001   CATARACT EXTRACTION, BILATERAL     COSMETIC SURGERY     CYSTECTOMY  1991   back   INNER EAR  SURGERY  1972 and 1979   KNEE SURGERY     TUBAL LIGATION  1969    Current Medications: Current Meds  Medication Sig   aspirin 81 MG tablet Take 81 mg by mouth daily.     ezetimibe (ZETIA) 10 MG tablet TAKE 1 TABLET BY MOUTH EVERY DAY   Multiple Vitamins-Minerals (CENTRUM SILVER PO) Take 3 tablets by mouth daily.   Omega-3 Fatty Acids (FISH OIL) 1200 MG CAPS Take 1 capsule by mouth daily.   primidone (MYSOLINE) 50 MG tablet Take 150 mg by mouth.   propranolol (INDERAL LA) 160 MG SR capsule Take 160 mg by mouth daily.    rosuvastatin (CRESTOR) 10 MG tablet Take 10 mg by mouth at bedtime.   SUMAtriptan (IMITREX) 50 MG tablet Take 50 mg by mouth as needed.    traZODone (DESYREL) 50 MG  tablet Take 50 mg by mouth at bedtime.   VITAMIN D PO Take 1 tablet by mouth daily.     Allergies:   Cephalexin, Doxycycline, and Naproxen   Social History   Socioeconomic History   Marital status: Married    Spouse name: Not on file   Number of children: Not on file   Years of education: Not on file   Highest education level: Not on file  Occupational History   Not on file  Tobacco Use   Smoking status: Never   Smokeless tobacco: Never  Vaping Use   Vaping Use: Never used  Substance and Sexual Activity   Alcohol use: No   Drug use: No   Sexual activity: Not Currently    Partners: Male    Birth control/protection: Post-menopausal    Comment: 1ST INTERCOURSE- 79, PARTNERS- 2  Other Topics Concern   Not on file  Social History Narrative   Not on file   Social Determinants of Health   Financial Resource Strain: Not on file  Food Insecurity: Not on file  Transportation Needs: Not on file  Physical Activity: Not on file  Stress: Not on file  Social Connections: Not on file     Family History: The patient's family history includes Diabetes in her father and maternal aunt; Heart disease in her father. There is no history of Breast cancer.  ROS:   Please see the history of present illness.    Review of Systems  Constitutional:  Negative for malaise/fatigue.  Respiratory:  Positive for shortness of breath.   Cardiovascular:  Positive for chest pain (Isolated episode) and leg swelling. Negative for palpitations, orthopnea, claudication and PND.  Gastrointestinal:  Negative for nausea and vomiting.  Musculoskeletal:  Negative for falls.  Neurological:  Positive for headaches. Negative for dizziness and loss of consciousness.     EKGs/Labs/Other Studies Reviewed:    The following studies were reviewed today:  TTE 11/2021: IMPRESSIONS   1. Left ventricular ejection fraction, by estimation, is 60 to 65%. The  left ventricle has normal function. The left ventricle has  no regional  wall motion abnormalities. Left ventricular diastolic parameters were  normal. The average left ventricular  global longitudinal strain is -21.9 %. The global longitudinal strain is  normal.   2. Right ventricular systolic function is normal. The right ventricular  size is normal.   3. The mitral valve is normal in structure. Mild mitral valve  regurgitation. No evidence of mitral stenosis.   4. The aortic valve is normal in structure. Aortic valve regurgitation is  not visualized. No aortic stenosis is present.   5.  The inferior vena cava is normal in size with greater than 50%  respiratory variability, suggesting right atrial pressure of 3 mmHg.   Comparison(s): Prior images reviewed side by side.   NST 10/29/19 The left ventricular ejection fraction is hyperdynamic (>65%). Nuclear stress EF: 69%. There was no ST segment deviation noted during stress. This is a low risk study.   There is a mild defect at rest seen throughout a large portion of the apical and inferior regions. This normalizes with stress. Cannot determine if this is baseline artifact or hibernating myocardium. Normal perfusion at stress, no ischemia seen.   2D echo 10/29/19  1. Normal LV systolic function; grade 1 diastolic dysfunction; mildly  dilated ascending aorta.   2. Left ventricular ejection fraction, by estimation, is 55 to 60%. The  left ventricle has normal function. The left ventricle has no regional  wall motion abnormalities. Left ventricular diastolic parameters are  consistent with Grade I diastolic  dysfunction (impaired relaxation).   3. Right ventricular systolic function is normal. The right ventricular  size is normal. Tricuspid regurgitation signal is inadequate for assessing  PA pressure.   4. The mitral valve is abnormal. Trivial mitral valve regurgitation. No  evidence of mitral stenosis.   5. The aortic valve is tricuspid. Aortic valve regurgitation is not  visualized. Mild  aortic valve sclerosis is present, with no evidence of  aortic valve stenosis.   6. Aortic dilatation noted. There is mild dilatation of the ascending  aorta measuring 38 mm.   7. The inferior vena cava is normal in size with greater than 50%  respiratory variability, suggesting right atrial pressure of 3 mmHg.   EKG:  EKG is personally reviewed. 04/17/2022:  Sinus rhythm. Rate 63 bpm.  10/17/2021:  NSR, first degree AVB, SVE  Recent Labs: No results found for requested labs within last 365 days.   Recent Lipid Panel    Component Value Date/Time   CHOL 216 (H) 10/07/2019 1532   CHOL FAXED RESULT TO THE CLIENT_ 11/18/2014 1447   TRIG 175 (H) 10/07/2019 1532   TRIG FAXED RESULT TO THE CLIENT_ 11/18/2014 1447   HDL 60 10/07/2019 1532   HDL FAXED RESULT TO THE CLIENT_ 11/18/2014 1447   CHOLHDL 3.6 10/07/2019 1532   LDLCALC 125 (H) 10/07/2019 1532   [place] FAXED RESULT TO THE CLIENT_ 11/18/2014 1447     Risk Assessment/Calculations:           Physical Exam:    VS:  BP 112/62   Pulse 63   Ht '5\' 3"'$  (1.6 m)   Wt 172 lb 3.2 oz (78.1 kg)   SpO2 97%   BMI 30.50 kg/m     Wt Readings from Last 3 Encounters:  04/17/22 172 lb 3.2 oz (78.1 kg)  10/18/21 172 lb (78 kg)  09/04/21 173 lb (78.5 kg)     GEN:  Well nourished, well developed in no acute distress HEENT: Normal NECK: No JVD; No carotid bruits CARDIAC: RRR, no murmurs RESPIRATORY:  Clear to auscultation without rales, wheezing or rhonchi  ABDOMEN: Soft, non-tender, non-distended MUSCULOSKELETAL:  No edema; No deformity  SKIN: Warm and dry NEUROLOGIC:  Alert and oriented x 3 PSYCHIATRIC:  Normal affect   ASSESSMENT:    1. Exertional dyspnea   2. Hyperlipidemia LDL goal <70   3. Essential hypertension   4. Mild CAD     PLAN:    In order of problems listed above:  #Exertional Dyspnea: Reassuring cardiac work-up with TTE  11/2021 showing normal BiV function, no significant valve disease. Coronary CTA 2019  with minimal disease. Overall, symptoms have improved. -Continue lifestyle modifications and increase walking -CTA 2019 with no disease, Ca score 0 -Myoview 2021 with no ischemia  #Minimal CAD: Coronary CTA with minimal disease. Ca score 0.  -Continue crestor '10mg'$  daily -Continue zetia '10mg'$  daily -Lipids per PCP  #PVCs: -Continue propranolol '160mg'$  daily     Follow-up:  6 months.  Medication Adjustments/Labs and Tests Ordered: Current medicines are reviewed at length with the patient today.  Concerns regarding medicines are outlined above.   Orders Placed This Encounter  Procedures   EKG 12-Lead   No orders of the defined types were placed in this encounter.  Patient Instructions  Medication Instructions:   Your physician recommends that you continue on your current medications as directed. Please refer to the Current Medication list given to you today.  *If you need a refill on your cardiac medications before your next appointment, please call your pharmacy*    Follow-Up: At [facility], you and your health needs are our priority.  As part of our continuing mission to provide you with exceptional heart care, we have created designated Provider Care Teams.  These Care Teams include your primary Cardiologist (physician) and Advanced Practice Providers (APPs -  Physician Assistants and Nurse Practitioners) who all work together to provide you with the care you need, when you need it.  We recommend signing up for the patient portal called "MyChart".  Sign up information is provided on this After Visit Summary.  MyChart is used to connect with patients for Virtual Visits (Telemedicine).  Patients are able to view lab/test results, encounter notes, upcoming appointments, etc.  Non-urgent messages can be sent to your provider as well.   To learn more about what you can do with MyChart, go to [email or web address].    Your next appointment:   6 month(s)  The format for  your next appointment:   In Person  Provider:   DR. Johney Frame   Important Information About Sugar        I,Mathew Stumpf,acting as a scribe for Freada Bergeron, MD.,have documented all relevant documentation on the behalf of Freada Bergeron, MD,as directed by  Freada Bergeron, MD while in the presence of Freada Bergeron, MD.  I, Freada Bergeron, MD, have reviewed all documentation for this visit. The documentation on 04/17/22 for the exam, diagnosis, procedures, and orders are all accurate and complete.    Signed, Freada Bergeron, MD  04/17/2022 4:55 PM    [facility]

## 2022-04-17 NOTE — Patient Instructions (Signed)
Medication Instructions:   Your physician recommends that you continue on your current medications as directed. Please refer to the Current Medication list given to you today.  *If you need a refill on your cardiac medications before your next appointment, please call your pharmacy*   Follow-Up: At [facility], you and your health needs are our priority.  As part of our continuing mission to provide you with exceptional heart care, we have created designated Provider Care Teams.  These Care Teams include your primary Cardiologist (physician) and Advanced Practice Providers (APPs -  Physician Assistants and Nurse Practitioners) who all work together to provide you with the care you need, when you need it.  We recommend signing up for the patient portal called "MyChart".  Sign up information is provided on this After Visit Summary.  MyChart is used to connect with patients for Virtual Visits (Telemedicine).  Patients are able to view lab/test results, encounter notes, upcoming appointments, etc.  Non-urgent messages can be sent to your provider as well.   To learn more about what you can do with MyChart, go to [email or web address].    Your next appointment:   6 month(s)  The format for your next appointment:   In Person  Provider:   DR. PEMBERTON  Important Information About Sugar       

## 2022-06-21 ENCOUNTER — Other Ambulatory Visit: Payer: Self-pay | Admitting: Internal Medicine

## 2022-06-21 MED ORDER — PAXLOVID (300/100) 20 X 150 MG & 10 X 100MG PO TBPK: 1.0000 | ORAL_TABLET | Freq: Two times a day (BID) | ORAL | 0 refills | Status: AC

## 2022-08-13 DIAGNOSIS — N3000 Acute cystitis without hematuria: Secondary | ICD-10-CM | POA: Diagnosis not present

## 2022-08-20 ENCOUNTER — Ambulatory Visit: Payer: Medicare Other | Admitting: Internal Medicine

## 2022-08-20 ENCOUNTER — Encounter: Payer: Self-pay | Admitting: Internal Medicine

## 2022-08-20 VITALS — BP 148/82 | HR 78 | Temp 98.0°F | Resp 18

## 2022-08-20 DIAGNOSIS — R059 Cough, unspecified: Secondary | ICD-10-CM | POA: Diagnosis not present

## 2022-08-20 DIAGNOSIS — J069 Acute upper respiratory infection, unspecified: Secondary | ICD-10-CM

## 2022-08-20 DIAGNOSIS — R6883 Chills (without fever): Secondary | ICD-10-CM | POA: Diagnosis not present

## 2022-08-20 NOTE — Assessment & Plan Note (Addendum)
She has chills, she will drink plenty of water, take tylenol 650 mg every 6 hours for 2 days. I will do cxray stat

## 2022-08-20 NOTE — Assessment & Plan Note (Addendum)
Her COVID test is negative, will do cxray

## 2022-08-20 NOTE — Progress Notes (Signed)
   Acute Office Visit  Subjective:     Patient ID: Heidi Tucker, female    DOB: May 17, 1941, 82 y.o.   MRN: [id number]  Chief Complaint  Patient presents with   office visit    Low grade fever chills     HPI Patient is in today for not feeling well since Sunday, today she started shivering with mild cough. No fever, she is not feeling well. She tested her COVID today that was negative. She is here to be seen.  Review of Systems  Constitutional:  Positive for chills.  Respiratory:  Positive for cough. Negative for shortness of breath.   Cardiovascular: Negative.   Gastrointestinal: Negative.         Objective:    BP (!) 148/82 (BP Location: Left Arm, Patient Position: Sitting, Cuff Size: Normal)   Pulse 78   Temp 98 F (36.7 C)   Resp 18   SpO2 98%    Physical Exam Constitutional:      Appearance: Normal appearance. She is obese.  HENT:     Head: Normocephalic and atraumatic.  Eyes:     Extraocular Movements: Extraocular movements intact.     Pupils: Pupils are equal, round, and reactive to light.  Pulmonary:     Effort: Pulmonary effort is normal.     Breath sounds: Normal breath sounds.  Abdominal:     General: Bowel sounds are normal.     Palpations: Abdomen is soft.  Neurological:     Mental Status: She is alert and oriented to person, place, and time.     No results found for any visits on 08/20/22.      Assessment & Plan:   Problem List Items Addressed This Visit       Respiratory   Upper respiratory infection - Primary    Her COVID test is negative, will do cxray, UA         Other   Chills    She has chills, she will drink plenty of water, take tylenol 650 mg every 6 hours for 2 days.        No orders of the defined types were placed in this encounter.   No follow-ups on file.  Garwin Brothers, MD

## 2022-08-21 ENCOUNTER — Telehealth: Payer: Self-pay | Admitting: Internal Medicine

## 2022-08-21 NOTE — Telephone Encounter (Signed)
I have spoken with her, she does not have any cough or chills today. No fever, she is feeling better.  She has cxray yesterday but I do not have any results

## 2022-09-10 DIAGNOSIS — G250 Essential tremor: Secondary | ICD-10-CM | POA: Diagnosis not present

## 2022-09-16 ENCOUNTER — Other Ambulatory Visit: Payer: Self-pay | Admitting: Internal Medicine

## 2022-09-24 ENCOUNTER — Ambulatory Visit: Payer: Medicare Other | Admitting: Internal Medicine

## 2022-09-24 ENCOUNTER — Encounter: Payer: Self-pay | Admitting: Internal Medicine

## 2022-09-24 VITALS — BP 128/78 | HR 73 | Temp 97.6°F | Resp 18 | Ht 63.0 in | Wt 170.2 lb

## 2022-09-24 DIAGNOSIS — I10 Essential (primary) hypertension: Secondary | ICD-10-CM

## 2022-09-24 DIAGNOSIS — E1169 Type 2 diabetes mellitus with other specified complication: Secondary | ICD-10-CM | POA: Insufficient documentation

## 2022-09-24 DIAGNOSIS — E785 Hyperlipidemia, unspecified: Secondary | ICD-10-CM

## 2022-09-24 DIAGNOSIS — R251 Tremor, unspecified: Secondary | ICD-10-CM | POA: Diagnosis not present

## 2022-09-24 DIAGNOSIS — E782 Mixed hyperlipidemia: Secondary | ICD-10-CM | POA: Diagnosis not present

## 2022-09-24 NOTE — Assessment & Plan Note (Signed)
She will continue with current medication and follow with neurologist.

## 2022-09-24 NOTE — Progress Notes (Signed)
   Office Visit  Subjective   Patient ID: Heidi Tucker   DOB: [birth year]   Age: 82 y.o.   MRN: [id number]   Chief Complaint Chief Complaint  Patient presents with   office visit    Check up     History of Present Illness 82 years old female with history of diabetes, dyslipidemia, hypertension and essential tremors who is here for follow up. She check her sugar twice a day and take metformin 500 mg daily. She says her sugar been ok. No hypoglycemia. She has seen eye doctor for diabetic eye examination within last 1 year.  She also take medicine for high cholesterol, zetia and rosuvastatin. She is due for labs today.  She also has hypertension and her blood pressure is controlled.  She also take medicine for essential tremors.   Past Medical History Past Medical History:  Diagnosis Date   Arthritis    hands   Bilateral swelling of feet    Diabetes mellitus    Generalized headaches    Hearing loss    Hyperlipidemia    Mild CAD    Mild dilation of ascending aorta    PVC's (premature ventricular contractions)    Status post deep brain stimulator placement    Wears glasses    Wears hearing aid      Allergies Allergies  Allergen Reactions   Cephalexin Nausea And Vomiting   Doxycycline Nausea And Vomiting   Naproxen Nausea And Vomiting     Review of Systems Review of Systems  Constitutional: Negative.   HENT: Negative.    Respiratory: Negative.    Cardiovascular: Negative.   Gastrointestinal: Negative.   Neurological: Negative.        Objective:    Vitals BP 128/78 (BP Location: Left Arm, Patient Position: Sitting, Cuff Size: Normal)   Pulse 73   Temp 97.6 F (36.4 C)   Resp 18   Ht  (1.6 m)   Wt 170 lb 4 oz (77.2 kg)   SpO2 97%   BMI 30.16 kg/m    Physical Examination Physical Exam Constitutional:      Appearance: Normal appearance. She is obese.  Cardiovascular:     Rate and Rhythm: Normal rate.     Heart sounds: Normal heart sounds.   Pulmonary:     Breath sounds: Normal breath sounds.  Abdominal:     General: Bowel sounds are normal.     Palpations: Abdomen is soft.  Neurological:     General: No focal deficit present.        Assessment & Plan:   Essential hypertension controlled  Dyslipidemia associated with type 2 diabetes mellitus She is on metformin and rosuvastatin, I will do HbA1c, urine microalbuminurea and lipid panel today  Tremors of nervous system She will continue with current medication and follow with neurologist.    Return in about 3 months (around 12/24/2022).   Eloisa Northern, MD

## 2022-09-24 NOTE — Assessment & Plan Note (Signed)
She is on metformin and rosuvastatin, I will do HbA1c, urine microalbuminurea and lipid panel today

## 2022-09-24 NOTE — Assessment & Plan Note (Signed)
controlled 

## 2022-09-25 LAB — CMP14 + ANION GAP
ALT: 15 IU/L (ref 0–32)
AST: 17 IU/L (ref 0–40)
Albumin/Globulin Ratio: 1.9 (ref 1.2–2.2)
Albumin: 4.2 g/dL (ref 3.7–4.7)
Alkaline Phosphatase: 80 IU/L (ref 44–121)
Anion Gap: 15 mmol/L (ref 10.0–18.0)
BUN/Creatinine Ratio: 19 (ref 12–28)
BUN: 16 mg/dL (ref 8–27)
Bilirubin Total: <0.2 mg/dL (ref 0.0–1.2)
CO2: 22 mmol/L (ref 20–29)
Calcium: 9.2 mg/dL (ref 8.7–10.3)
Chloride: 100 mmol/L (ref 96–106)
Creatinine, Ser: 0.86 mg/dL (ref 0.57–1.00)
Globulin, Total: 2.2 g/dL (ref 1.5–4.5)
Glucose: 157 mg/dL — ABNORMAL HIGH (ref 70–99)
Potassium: 4.5 mmol/L (ref 3.5–5.2)
Sodium: 137 mmol/L (ref 134–144)
Total Protein: 6.4 g/dL (ref 6.0–8.5)
eGFR: 67 mL/min/{1.73_m2} (ref >59)

## 2022-09-25 LAB — LIPID PANEL
Chol/HDL Ratio: 2.3 ratio (ref 0.0–4.4)
Cholesterol, Total: 132 mg/dL (ref 100–199)
HDL: 57 mg/dL (ref >39)
LDL Chol Calc (NIH): 48 mg/dL (ref 0–99)
Triglycerides: 160 mg/dL — ABNORMAL HIGH (ref 0–149)
VLDL Cholesterol Cal: 27 mg/dL (ref 5–40)

## 2022-09-25 LAB — MICROALBUMIN / CREATININE URINE RATIO
Creatinine, Urine: 45.9 mg/dL
Microalb/Creat Ratio: 7 mg/g creat (ref 0–29)
Microalbumin, Urine: 3.4 ug/mL

## 2022-09-25 LAB — HEMOGLOBIN A1C
Est. average glucose Bld gHb Est-mCnc: 160 mg/dL
Hgb A1c MFr Bld: 7.2 % — ABNORMAL HIGH (ref 4.8–5.6)

## 2022-09-26 ENCOUNTER — Telehealth: Payer: Self-pay | Admitting: Internal Medicine

## 2022-09-26 MED ORDER — DAPAGLIFLOZIN PROPANEDIOL 10 MG PO TABS: 10.0000 mg | ORAL_TABLET | Freq: Every day | ORAL | 6 refills | Status: DC

## 2022-09-26 NOTE — Telephone Encounter (Signed)
I have spoken with her about her blood test results. I will add farxiga 10 mg daily to bring HbA1c below 7.0%.

## 2022-10-13 NOTE — Progress Notes (Signed)
Cardiology Office Note:    Date:  10/21/2022   ID:  Heidi Tucker, DOB 09/10/1940, MRN [id number]  PCP:  Heidi Northern, MD   [facility] Providers Cardiologist:  None { Referring MD: Heidi Banner, MD    History of Present Illness:    Heidi Tucker is a 82 y.o. female originally from [place] with history of arthritis, DM, HLD, mild dilation of ascending aorta, deep brain stimulator/tremors, PVCs and minimal CAD by CT in 2019 who presents to clinic for follow-up. Was previously followed by Dr. Delton Tucker.   Per review of the record, the patient has a family history of CAD in her father, MI in his 51s. She previously was seen by Dr. Delton Tucker for chest pain, DOE and palpitations. In 2016 she had an equivocal stress test due to poor image quality which was followed up by a coronary calcium score that was zero. 2D echo 11/2014 showed EF 55-60%, grade 1 DD. She presented with recurrent symptoms in 10/2017 and underwent a full coronary CTA that showed minimal nonobstructive CAD and mildly dilated pulmonary artery. Event monitor 12/2017 showed sinus brady-sinus tach, isolated PVCs. She has been on propranolol. She was  seen in clinic for exertional dyspnea similar to prior, with mild progression in the context of decreasing physical activity. 2D echo was done 10/29/19 showing EF 55-60%, grade 1 DD, mild dilation of ascending aorta. NST 10/29/19 was normal.   Was seen in clinic on 10/2021 where he was having dyspnea on exertion. TTE 11/2021 with LVEF 60-65%, normal RV, mild MR. Notably, her coronary CTA had no disease and Ca score 0 in 2019.  Was last seen in clinic on 04/2022 where she was doing well from a CV standpoint.  Today, the patient states she had a pain in her chest about 3 weeks ago following chair yoga. Pain lasted about before resolving. Pain was sharp in nature and did not radiate. Has exercised since that time with no recurrence of symptoms. She is very concerned about her symptoms given  her family history of CAD. Discussed option of close monitoring given isolated episode vs repeating coronary CTA and she prefers to pursue imaging at this time.  Otherwise, occasional trace LE edema. No orthopnea or PND. Blood pressure is well controlled.    Past Medical History:  Diagnosis Date   Arthritis    hands   Bilateral swelling of feet    Diabetes mellitus    Generalized headaches    Hearing loss    Hyperlipidemia    Mild CAD    Mild dilation of ascending aorta (HCC)    PVC's (premature ventricular contractions)    Status post deep brain stimulator placement    Wears glasses    Wears hearing aid     Past Surgical History:  Procedure Laterality Date   brain transmitter  2011   help to correct tremor   BRAIN TRANSMITTER  08/2019   brain transmitter     removal for infection 2023   BUNIONECTOMY  2001   CATARACT EXTRACTION, BILATERAL     COSMETIC SURGERY     CYSTECTOMY  1991   back   INNER EAR SURGERY  1972 and 1979   KNEE SURGERY     TUBAL LIGATION  1969    Current Medications: Current Meds  Medication Sig   aspirin 81 MG tablet Take 81 mg by mouth daily.     dapagliflozin propanediol (FARXIGA) 10 MG TABS tablet Take 1 tablet (10 mg total) by  mouth daily before breakfast.   ezetimibe (ZETIA) 10 MG tablet TAKE 1 TABLET BY MOUTH EVERY DAY   Multiple Vitamins-Minerals (CENTRUM SILVER PO) Take 3 tablets by mouth daily.   Omega-3 Fatty Acids (FISH OIL) 1200 MG CAPS Take 1 capsule by mouth daily.   primidone (MYSOLINE) 50 MG tablet Take 150 mg by mouth.   propranolol (INDERAL LA) 160 MG SR capsule Take 160 mg by mouth daily.    rosuvastatin (CRESTOR) 10 MG tablet TAKE 1 TABLET BY MOUTH ONCE DAILY   SUMAtriptan (IMITREX) 50 MG tablet Take 50 mg by mouth as needed.    traZODone (DESYREL) 50 MG tablet Take 50 mg by mouth at bedtime.   VITAMIN D PO Take 1 tablet by mouth daily.     Allergies:   Cephalexin, Doxycycline, and Naproxen   Social History    Socioeconomic History   Marital status: Married    Spouse name: Not on file   Number of children: Not on file   Years of education: Not on file   Highest education level: Not on file  Occupational History   Not on file  Tobacco Use   Smoking status: Never   Smokeless tobacco: Never  Vaping Use   Vaping Use: Never used  Substance and Sexual Activity   Alcohol use: No   Drug use: No   Sexual activity: Not Currently    Partners: Male    Birth control/protection: Post-menopausal    Comment: 1ST INTERCOURSE- 44, PARTNERS- 2  Other Topics Concern   Not on file  Social History Narrative   Not on file   Social Determinants of Health   Financial Resource Strain: Not on file  Food Insecurity: Not on file  Transportation Needs: Not on file  Physical Activity: Not on file  Stress: Not on file  Social Connections: Not on file     Family History: The patient's family history includes Diabetes in her father and maternal aunt; Heart disease in her father. There is no history of Breast cancer.  ROS:   Please Tucker the history of present illness.    Review of Systems  Constitutional:  Negative for malaise/fatigue.  Respiratory:  Positive for shortness of breath.   Cardiovascular:  Positive for chest pain and leg swelling. Negative for palpitations, orthopnea, claudication and PND.  Gastrointestinal:  Negative for blood in stool, melena, nausea and vomiting.  Genitourinary:  Negative for hematuria.  Musculoskeletal:  Negative for falls.  Neurological:  Negative for dizziness and loss of consciousness.     EKGs/Labs/Other Studies Reviewed:    The following studies were reviewed today:  TTE 11/2021: IMPRESSIONS   1. Left ventricular ejection fraction, by estimation, is 60 to 65%. The  left ventricle has normal function. The left ventricle has no regional  wall motion abnormalities. Left ventricular diastolic parameters were  normal. The average left ventricular  global  longitudinal strain is -21.9 %. The global longitudinal strain is  normal.   2. Right ventricular systolic function is normal. The right ventricular  size is normal.   3. The mitral valve is normal in structure. Mild mitral valve  regurgitation. No evidence of mitral stenosis.   4. The aortic valve is normal in structure. Aortic valve regurgitation is  not visualized. No aortic stenosis is present.   5. The inferior vena cava is normal in size with greater than 50%  respiratory variability, suggesting right atrial pressure of 3 mmHg.   Comparison(s): Prior images reviewed side by side.  NST 10/29/19 The left ventricular ejection fraction is hyperdynamic (>65%). Nuclear stress EF: 69%. There was no ST segment deviation noted during stress. This is a low risk study.   There is a mild defect at rest seen throughout a large portion of the apical and inferior regions. This normalizes with stress. Cannot determine if this is baseline artifact or hibernating myocardium. Normal perfusion at stress, no ischemia seen.   2D echo 10/29/19  1. Normal LV systolic function; grade 1 diastolic dysfunction; mildly  dilated ascending aorta.   2. Left ventricular ejection fraction, by estimation, is 55 to 60%. The  left ventricle has normal function. The left ventricle has no regional  wall motion abnormalities. Left ventricular diastolic parameters are  consistent with Grade I diastolic  dysfunction (impaired relaxation).   3. Right ventricular systolic function is normal. The right ventricular  size is normal. Tricuspid regurgitation signal is inadequate for assessing  PA pressure.   4. The mitral valve is abnormal. Trivial mitral valve regurgitation. No  evidence of mitral stenosis.   5. The aortic valve is tricuspid. Aortic valve regurgitation is not  visualized. Mild aortic valve sclerosis is present, with no evidence of  aortic valve stenosis.   6. Aortic dilatation noted. There is mild  dilatation of the ascending  aorta measuring 38 mm.   7. The inferior vena cava is normal in size with greater than 50%  respiratory variability, suggesting right atrial pressure of 3 mmHg.   EKG:  No new tracing today  Recent Labs: 09/24/2022: ALT 15; BUN 16; Creatinine, Ser 0.86; Potassium 4.5; Sodium 137   Recent Lipid Panel    Component Value Date/Time   CHOL 132 09/24/2022 1444   CHOL FAXED RESULT TO THE CLIENT_ 11/18/2014 1447   TRIG 160 (H) 09/24/2022 1444   TRIG FAXED RESULT TO THE CLIENT_ 11/18/2014 1447   HDL 57 09/24/2022 1444   HDL FAXED RESULT TO THE CLIENT_ 11/18/2014 1447   CHOLHDL 2.3 09/24/2022 1444   LDLCALC 48 09/24/2022 1444   LDLCALC FAXED RESULT TO THE CLIENT_ 11/18/2014 1447     Risk Assessment/Calculations:           Physical Exam:    VS:  BP 126/66   Pulse 65   Ht 5\' 3"  (1.6 m)   Wt 170 lb 3.2 oz (77.2 kg)   SpO2 97%   BMI 30.15 kg/m     Wt Readings from Last 3 Encounters:  10/21/22 170 lb 3.2 oz (77.2 kg)  09/24/22 170 lb 4 oz (77.2 kg)  04/17/22 172 lb 3.2 oz (78.1 kg)     GEN:  Well nourished, well developed in no acute distress HEENT: Normal NECK: No JVD; No carotid bruits CARDIAC: RRR, no murmurs RESPIRATORY:  Clear to auscultation without rales, wheezing or rhonchi  ABDOMEN: Soft, non-tender, non-distended MUSCULOSKELETAL:  Trace ankle edema, warm SKIN: Warm and dry NEUROLOGIC:  Alert and oriented x 3 PSYCHIATRIC:  Normal affect   ASSESSMENT:    1. Precordial chest pain   2. Essential hypertension   3. Mild CAD   4. Hyperlipidemia LDL goal <70   5. Premature ventricular contractions   6. Chest pain of uncertain etiology   7. Exertional dyspnea   8. Precordial pain     PLAN:    In order of problems listed above:  #Chest Pain: Patient with episode of chest pain following chair yoga. Pain lasted before resolving. Prior cardiac work-up in the past reassuring with normal coronarCTA in  2019 and normal myoview in  2021. Discussed option of continued monitoring, however, patient prefers to repeat CTA to evaluate given symptoms were following exertion.  -Check coronary CTA  #HTN: -Controlled and at goal <130/90 -Continue propranolol 160mg  daily  #PVCs: -Continue propranolol 160mg  daily  #DMII: -Continue farxiga 10mg  daily  #HLD: -Continue crestor 10mg  daily -Continue zetia 10mg  daily -LDL at goal 48     Follow-up:  6 months.  Medication Adjustments/Labs and Tests Ordered: Current medicines are reviewed at length with the patient today.  Concerns regarding medicines are outlined above.   Orders Placed This Encounter  Procedures   CT CORONARY MORPH W/CTA COR W/SCORE W/CA W/CM &/OR WO/CM   No orders of the defined types were placed in this encounter.  Patient Instructions  Medication Instructions:   Your physician recommends that you continue on your current medications as directed. Please refer to the Current Medication list given to you today.  *If you need a refill on your cardiac medications before your next appointment, please call your pharmacy*   Testing/Procedures:    Your cardiac CT will be scheduled at one of the below locations:   [facility] [address] [place], [place] [address] [phone]    If scheduled at [facility], please arrive at the [facility] and Children's Entrance (Entrance C2) of [facility] 30 minutes prior to test start time. You can use the FREE valet parking offered at entrance C (encouraged to control the heart rate for the test)  Proceed to the [facility] Radiology Department (first floor) to check-in and test prep.  All radiology patients and guests should use entrance C2 at [facility], accessed from [facility], even though the hospital's physical address listed is [address].       Please follow these instructions carefully (unless otherwise directed):   On the Night  Before the Test: Be sure to Drink plenty of water. Do not consume any caffeinated/decaffeinated beverages or chocolate 12 hours prior to your test. Do not take any antihistamines 12 hours prior to your test.   On the Day of the Test: Drink plenty of water until 1 hour prior to the test. Do not eat any food 1 hour prior to test. You may take your regular medications prior to the test.  Take PROPRANOLOL 160 MG BY MOUTH two hours prior to test. FEMALES- please wear underwire-free bra if available, avoid dresses & tight clothing        After the Test: Drink plenty of water. After receiving IV contrast, you may experience a mild flushed feeling. This is normal. On occasion, you may experience a mild rash up to 24 hours after the test. This is not dangerous. If this occurs, you can take Benadryl 25 mg and increase your fluid intake. If you experience trouble breathing, this can be serious. If it is severe call 911 IMMEDIATELY. If it is mild, please call our office.   We will call to schedule your test 2-4 weeks out understanding that some insurance companies will need an authorization prior to the service being performed.   For non-scheduling related questions, please contact the cardiac imaging nurse navigator should you have any questions/concerns: Rockwell Alexandria, Cardiac Imaging Nurse Navigator Larey Brick, Cardiac Imaging Nurse Navigator [facility] Direct Office Dial: [phone]   For scheduling needs, including cancellations and rescheduling, please call [place], [phone].    Follow-Up: At [facility], you and  your health needs are our priority.  As part of our continuing mission to provide you with exceptional heart care, we have created designated Provider Care Teams.  These Care Teams include your primary Cardiologist (physician) and Advanced Practice Providers (APPs -  Physician Assistants and Nurse Practitioners) who all work  together to provide you with the care you need, when you need it.  We recommend signing up for the patient portal called "MyChart".  Sign up information is provided on this After Visit Summary.  MyChart is used to connect with patients for Virtual Visits (Telemedicine).  Patients are able to view lab/test results, encounter notes, upcoming appointments, etc.  Non-urgent messages can be sent to your provider as well.   To learn more about what you can do with MyChart, go to [email or web address].    Your next appointment:   6 month(s)  Provider:   DR. Shari Prows      Signed, Meriam Sprague, MD  10/21/2022 3:17 PM    [facility]

## 2022-10-21 ENCOUNTER — Encounter: Payer: Self-pay | Admitting: Cardiology

## 2022-10-21 ENCOUNTER — Ambulatory Visit: Payer: Medicare Other | Attending: Cardiology | Admitting: Cardiology

## 2022-10-21 VITALS — BP 126/66 | HR 65 | Ht 63.0 in | Wt 170.2 lb

## 2022-10-21 DIAGNOSIS — I10 Essential (primary) hypertension: Secondary | ICD-10-CM

## 2022-10-21 DIAGNOSIS — R072 Precordial pain: Secondary | ICD-10-CM | POA: Diagnosis not present

## 2022-10-21 DIAGNOSIS — I2510 Atherosclerotic heart disease of native coronary artery without angina pectoris: Secondary | ICD-10-CM | POA: Diagnosis not present

## 2022-10-21 DIAGNOSIS — E785 Hyperlipidemia, unspecified: Secondary | ICD-10-CM | POA: Diagnosis not present

## 2022-10-21 DIAGNOSIS — I493 Ventricular premature depolarization: Secondary | ICD-10-CM | POA: Diagnosis not present

## 2022-10-21 DIAGNOSIS — R079 Chest pain, unspecified: Secondary | ICD-10-CM | POA: Diagnosis not present

## 2022-10-21 DIAGNOSIS — R0609 Other forms of dyspnea: Secondary | ICD-10-CM | POA: Diagnosis not present

## 2022-10-21 NOTE — Patient Instructions (Signed)
Medication Instructions:   Your physician recommends that you continue on your current medications as directed. Please refer to the Current Medication list given to you today.  *If you need a refill on your cardiac medications before your next appointment, please call your pharmacy*   Testing/Procedures:    Your cardiac CT will be scheduled at one of the below locations:   [facility] [address] [place], [place] [address] [phone]    If scheduled at [facility], please arrive at the [facility] and Children's Entrance (Entrance C2) of [facility] 30 minutes prior to test start time. You can use the FREE valet parking offered at entrance C (encouraged to control the heart rate for the test)  Proceed to the [facility] Radiology Department (first floor) to check-in and test prep.  All radiology patients and guests should use entrance C2 at [facility], accessed from [facility], even though the hospital's physical address listed is [address].       Please follow these instructions carefully (unless otherwise directed):   On the Night Before the Test: Be sure to Drink plenty of water. Do not consume any caffeinated/decaffeinated beverages or chocolate 12 hours prior to your test. Do not take any antihistamines 12 hours prior to your test.   On the Day of the Test: Drink plenty of water until 1 hour prior to the test. Do not eat any food 1 hour prior to test. You may take your regular medications prior to the test.  Take PROPRANOLOL 160 MG BY MOUTH two hours prior to test. FEMALES- please wear underwire-free bra if available, avoid dresses & tight clothing        After the Test: Drink plenty of water. After receiving IV contrast, you may experience a mild flushed feeling. This is normal. On occasion, you may experience a mild rash up to 24 hours after the test. This is not dangerous. If this occurs, you  can take Benadryl 25 mg and increase your fluid intake. If you experience trouble breathing, this can be serious. If it is severe call 911 IMMEDIATELY. If it is mild, please call our office.   We will call to schedule your test 2-4 weeks out understanding that some insurance companies will need an authorization prior to the service being performed.   For non-scheduling related questions, please contact the cardiac imaging nurse navigator should you have any questions/concerns: Rockwell Alexandria, Cardiac Imaging Nurse Navigator Larey Brick, Cardiac Imaging Nurse Navigator [facility] [facility] Direct Office Dial: [phone]   For scheduling needs, including cancellations and rescheduling, please call [place], [phone].    Follow-Up: At [facility], you and your health needs are our priority.  As part of our continuing mission to provide you with exceptional heart care, we have created designated Provider Care Teams.  These Care Teams include your primary Cardiologist (physician) and Advanced Practice Providers (APPs -  Physician Assistants and Nurse Practitioners) who all work together to provide you with the care you need, when you need it.  We recommend signing up for the patient portal called "MyChart".  Sign up information is provided on this After Visit Summary.  MyChart is used to connect with patients for Virtual Visits (Telemedicine).  Patients are able to view lab/test results, encounter notes, upcoming appointments, etc.  Non-urgent messages can be sent to your provider as well.   To learn more about what you can do with MyChart,  go to [email or web address].    Your next appointment:   6 month(s)  Provider:   DR. Shari Prows

## 2022-10-22 ENCOUNTER — Telehealth: Payer: Self-pay | Admitting: *Deleted

## 2022-10-22 NOTE — Telephone Encounter (Signed)
-----   Message from Lorrin Jackson sent at 10/22/2022  2:37 PM EDT ----- Regarding: ct scan Scheduled 5/24 at 2:30

## 2022-10-31 ENCOUNTER — Telehealth (HOSPITAL_COMMUNITY): Payer: Self-pay | Admitting: *Deleted

## 2022-10-31 NOTE — Telephone Encounter (Signed)
Attempted to call patient regarding upcoming cardiac CT appointment. °Left message on voicemail with name and callback number ° °Kortny Lirette RN Navigator Cardiac Imaging °[place] °[phone] Office °[phone] Cell ° °

## 2022-11-01 ENCOUNTER — Ambulatory Visit (HOSPITAL_COMMUNITY)
Admission: RE | Admit: 2022-11-01 | Discharge: 2022-11-01 | Disposition: A | Discharge status: Home / Self Care | Payer: Medicare Other | Source: Ambulatory Visit | Attending: Cardiology | Admitting: Cardiology

## 2022-11-01 DIAGNOSIS — I10 Essential (primary) hypertension: Secondary | ICD-10-CM

## 2022-11-01 DIAGNOSIS — R0609 Other forms of dyspnea: Secondary | ICD-10-CM

## 2022-11-01 DIAGNOSIS — R079 Chest pain, unspecified: Secondary | ICD-10-CM | POA: Diagnosis not present

## 2022-11-01 DIAGNOSIS — I2510 Atherosclerotic heart disease of native coronary artery without angina pectoris: Secondary | ICD-10-CM | POA: Diagnosis not present

## 2022-11-01 DIAGNOSIS — R072 Precordial pain: Secondary | ICD-10-CM

## 2022-11-01 DIAGNOSIS — I493 Ventricular premature depolarization: Secondary | ICD-10-CM | POA: Diagnosis not present

## 2022-11-01 DIAGNOSIS — E785 Hyperlipidemia, unspecified: Secondary | ICD-10-CM

## 2022-11-01 MED ORDER — NITROGLYCERIN 0.4 MG SL SUBL
0.8000 mg | SUBLINGUAL_TABLET | Freq: Once | SUBLINGUAL | Status: AC
  Administered 2022-11-01: 0.8 mg via SUBLINGUAL

## 2022-11-01 MED ORDER — NITROGLYCERIN 0.4 MG SL SUBL
SUBLINGUAL_TABLET | SUBLINGUAL | Status: AC
  Filled 2022-11-01: qty 2

## 2022-11-01 MED ORDER — IOHEXOL 350 MG/ML SOLN
100.0000 mL | Freq: Once | INTRAVENOUS | Status: AC | PRN
  Administered 2022-11-01: 100 mL via INTRAVENOUS

## 2022-11-05 ENCOUNTER — Encounter: Payer: Self-pay | Admitting: Internal Medicine

## 2022-11-05 ENCOUNTER — Ambulatory Visit: Payer: Medicare Other | Admitting: Internal Medicine

## 2022-11-05 VITALS — BP 130/78 | HR 69 | Temp 97.4°F | Resp 18 | Wt 167.0 lb

## 2022-11-05 DIAGNOSIS — J209 Acute bronchitis, unspecified: Secondary | ICD-10-CM | POA: Diagnosis not present

## 2022-11-05 MED ORDER — AZITHROMYCIN 250 MG PO TABS: ORAL_TABLET | ORAL | 0 refills | Status: AC

## 2022-11-05 MED ORDER — FEXOFENADINE HCL 180 MG PO TABS: 180.0000 mg | ORAL_TABLET | Freq: Every day | ORAL | 0 refills | Status: DC

## 2022-11-05 MED ORDER — GUAIFENESIN-CODEINE 100-10 MG/5ML PO SOLN: 10.0000 mL | Freq: Three times a day (TID) | ORAL | 0 refills | Status: DC | PRN

## 2022-11-05 NOTE — Assessment & Plan Note (Signed)
I will start her with z-pak and nasal spray. If she is not better then will do cxray.

## 2022-11-05 NOTE — Progress Notes (Signed)
   Acute Office Visit  Subjective:     Patient ID: Heidi Tucker, female    DOB: [birth year], 82 y.o.   MRN: [id number]  Chief Complaint  Patient presents with   office visit    Coughing     HPI Patient is in today for cough for 1 week. She went to [place] for graduation of her grand daughter. She met two other people who has pneumonia. She started having cough since she is back, with stuffy nose and cough is not getting better, she also has stuffy nose.  No fever or chills. No SOB.  Review of Systems  Constitutional: Negative.   HENT:  Positive for congestion.   Respiratory:  Positive for cough.   Cardiovascular: Negative.         Objective:    BP 130/78 (BP Location: Left Arm, Patient Position: Sitting, Cuff Size: Normal)   Pulse 69   Temp (!) 97.4 F (36.3 C)   Resp 18   Wt 167 lb (75.8 kg)   SpO2 97%   BMI 29.58 kg/m    Physical Exam Constitutional:      Appearance: Normal appearance.  HENT:     Head: Normocephalic and atraumatic.  Cardiovascular:     Rate and Rhythm: Normal rate and regular rhythm.     Heart sounds: Normal heart sounds.  Pulmonary:     Effort: Pulmonary effort is normal.     Breath sounds: Normal breath sounds.  Neurological:     Mental Status: She is alert.     No results found for any visits on 11/05/22.      Assessment & Plan:   Problem List Items Addressed This Visit       Respiratory   Acute bronchitis - Primary    I will start her with z-pak and nasal spray. If she is not better then will do cxray.       No orders of the defined types were placed in this encounter.   No follow-ups on file.  Eloisa Northern, MD

## 2022-11-22 ENCOUNTER — Encounter: Payer: Self-pay | Admitting: Obstetrics & Gynecology

## 2022-11-22 ENCOUNTER — Ambulatory Visit (INDEPENDENT_AMBULATORY_CARE_PROVIDER_SITE_OTHER): Payer: Medicare Other | Admitting: Obstetrics & Gynecology

## 2022-11-22 VITALS — BP 122/70 | HR 74 | Resp 20

## 2022-11-22 DIAGNOSIS — B3731 Acute candidiasis of vulva and vagina: Secondary | ICD-10-CM

## 2022-11-22 DIAGNOSIS — N9089 Other specified noninflammatory disorders of vulva and perineum: Secondary | ICD-10-CM

## 2022-11-22 LAB — WET PREP FOR TRICH, YEAST, CLUE

## 2022-11-22 MED ORDER — FLUCONAZOLE 150 MG PO TABS: 150.0000 mg | ORAL_TABLET | ORAL | 0 refills | Status: AC

## 2022-11-22 MED ORDER — NYSTATIN-TRIAMCINOLONE 100000-0.1 UNIT/GM-% EX OINT: 1.0000 | TOPICAL_OINTMENT | Freq: Two times a day (BID) | CUTANEOUS | 2 refills | Status: AC

## 2022-11-22 NOTE — Progress Notes (Signed)
    Heidi Tucker 01-Feb-1941 [id number]        82 y.o.  G2P2L2 Married.  Book binding expert, internationally recognized.  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 Denies urinary symptoms, odor, discharge or bleeding. Tried lidocaine cream externally, no relief.     OB History  Gravida Para Term Preterm AB Living  2 2       2   SAB IAB Ectopic Multiple Live Births               # Outcome Date GA Lbr Len/2nd Weight Sex Delivery Anes PTL Lv  2 Para           1 Para             Past medical history,surgical history, problem list, medications, allergies, family history and social history were all reviewed and documented in the EPIC chart.   Directed ROS with pertinent positives and negatives documented in the history of present illness/assessment and plan.  Exam:  Vitals:   11/22/22 0930  BP: 122/70  Pulse: 74  Resp: 20   General appearance:  Normal  Gynecologic exam: Vulva: Erythema throughout the vulva.  No white atrophy.  Increased beige discharge.  Wet prep done.  Wet prep: Yeasts   Assessment/Plan:  82 y.o. G2P2   1. 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 Denies urinary symptoms, odor, discharge or bleeding. Tried lidocaine cream externally, no relief.  Vulvar erythema with beige discharge.  Wet prep confirming Yeast vagino-vulvitis.  Will treat with Fluconazole and Mycolog.  Usage reviewed.  Prescriptions sent. - WET PREP FOR TRICH, YEAST, CLUE  Other orders - fluconazole (DIFLUCAN) 150 MG tablet; Take 1 tablet (150 mg total) by mouth every other day for 3 doses. - nystatin-triamcinolone ointment (MYCOLOG); Apply 1 Application topically 2 (two) times daily.   Genia Del MD, 9:38 AM 11/22/2022

## 2022-12-13 DIAGNOSIS — R300 Dysuria: Secondary | ICD-10-CM | POA: Diagnosis not present

## 2022-12-13 DIAGNOSIS — K219 Gastro-esophageal reflux disease without esophagitis: Secondary | ICD-10-CM | POA: Diagnosis not present

## 2022-12-13 DIAGNOSIS — G250 Essential tremor: Secondary | ICD-10-CM | POA: Diagnosis not present

## 2022-12-13 DIAGNOSIS — I2510 Atherosclerotic heart disease of native coronary artery without angina pectoris: Secondary | ICD-10-CM | POA: Diagnosis not present

## 2022-12-13 DIAGNOSIS — E119 Type 2 diabetes mellitus without complications: Secondary | ICD-10-CM | POA: Diagnosis not present

## 2022-12-13 DIAGNOSIS — G43009 Migraine without aura, not intractable, without status migrainosus: Secondary | ICD-10-CM | POA: Diagnosis not present

## 2022-12-13 DIAGNOSIS — I10 Essential (primary) hypertension: Secondary | ICD-10-CM | POA: Diagnosis not present

## 2022-12-24 ENCOUNTER — Ambulatory Visit: Payer: Medicare Other | Admitting: Internal Medicine

## 2022-12-30 ENCOUNTER — Other Ambulatory Visit: Payer: Self-pay | Admitting: Internal Medicine

## 2022-12-30 MED ORDER — TRAZODONE HCL 50 MG PO TABS: 50.0000 mg | ORAL_TABLET | Freq: Every day | ORAL | 2 refills | Status: AC

## 2023-03-13 DIAGNOSIS — G250 Essential tremor: Secondary | ICD-10-CM | POA: Diagnosis not present

## 2023-03-13 DIAGNOSIS — L600 Ingrowing nail: Secondary | ICD-10-CM | POA: Diagnosis not present

## 2023-03-13 DIAGNOSIS — E1165 Type 2 diabetes mellitus with hyperglycemia: Secondary | ICD-10-CM | POA: Diagnosis not present

## 2023-03-13 DIAGNOSIS — Z1231 Encounter for screening mammogram for malignant neoplasm of breast: Secondary | ICD-10-CM | POA: Diagnosis not present

## 2023-03-13 DIAGNOSIS — Z0000 Encounter for general adult medical examination without abnormal findings: Secondary | ICD-10-CM | POA: Diagnosis not present

## 2023-03-13 DIAGNOSIS — I2510 Atherosclerotic heart disease of native coronary artery without angina pectoris: Secondary | ICD-10-CM | POA: Diagnosis not present

## 2023-03-13 DIAGNOSIS — K219 Gastro-esophageal reflux disease without esophagitis: Secondary | ICD-10-CM | POA: Diagnosis not present

## 2023-03-13 DIAGNOSIS — I10 Essential (primary) hypertension: Secondary | ICD-10-CM | POA: Diagnosis not present

## 2023-03-13 DIAGNOSIS — Z79899 Other long term (current) drug therapy: Secondary | ICD-10-CM | POA: Diagnosis not present

## 2023-03-13 DIAGNOSIS — E119 Type 2 diabetes mellitus without complications: Secondary | ICD-10-CM | POA: Diagnosis not present

## 2023-03-13 DIAGNOSIS — G43009 Migraine without aura, not intractable, without status migrainosus: Secondary | ICD-10-CM | POA: Diagnosis not present

## 2023-03-13 DIAGNOSIS — B351 Tinea unguium: Secondary | ICD-10-CM | POA: Diagnosis not present

## 2023-03-13 DIAGNOSIS — D649 Anemia, unspecified: Secondary | ICD-10-CM | POA: Diagnosis not present

## 2023-03-14 ENCOUNTER — Other Ambulatory Visit: Payer: Self-pay | Admitting: Internal Medicine

## 2023-03-14 DIAGNOSIS — Z1231 Encounter for screening mammogram for malignant neoplasm of breast: Secondary | ICD-10-CM

## 2023-03-14 DIAGNOSIS — G250 Essential tremor: Secondary | ICD-10-CM | POA: Diagnosis not present

## 2023-03-15 ENCOUNTER — Inpatient Hospital Stay
Admission: RE | Admit: 2023-03-15 | Discharge: 2023-03-15 | Discharge status: Home / Self Care | Payer: Medicare Other | Source: Ambulatory Visit | Attending: Internal Medicine | Admitting: Internal Medicine

## 2023-03-15 DIAGNOSIS — Z1231 Encounter for screening mammogram for malignant neoplasm of breast: Secondary | ICD-10-CM | POA: Diagnosis not present

## 2023-03-19 DIAGNOSIS — D508 Other iron deficiency anemias: Secondary | ICD-10-CM | POA: Diagnosis not present

## 2023-03-20 ENCOUNTER — Ambulatory Visit: Payer: Medicare Other | Admitting: Podiatry

## 2023-03-20 ENCOUNTER — Encounter: Payer: Self-pay | Admitting: Podiatry

## 2023-03-20 DIAGNOSIS — L603 Nail dystrophy: Secondary | ICD-10-CM

## 2023-03-20 DIAGNOSIS — L608 Other nail disorders: Secondary | ICD-10-CM | POA: Diagnosis not present

## 2023-03-20 DIAGNOSIS — K5900 Constipation, unspecified: Secondary | ICD-10-CM | POA: Insufficient documentation

## 2023-03-20 NOTE — Progress Notes (Signed)
Subjective:  Patient ID: Heidi Tucker, female    DOB: July 23, 1940,  MRN: [id number] HPI Chief Complaint  Patient presents with   Nail Problem    Hallux bilateral - thick, discolored nails x years, tried multiple OTC products, tenderness on the left , diabetic - last A1c 7.2   New Patient (Initial Visit)    82 y.o. female presents with the above complaint.   ROS: Denies fever chills nausea vomiting muscle aches pains calf pain back pain chest pain shortness of breath.  Past Medical History:  Diagnosis Date   Arthritis    hands   Bilateral swelling of feet    Diabetes mellitus    Generalized headaches    Hearing loss    Hyperlipidemia    Mild CAD    Mild dilation of ascending aorta (HCC)    PVC's (premature ventricular contractions)    Status post deep brain stimulator placement    Wears glasses    Wears hearing aid    Past Surgical History:  Procedure Laterality Date   brain transmitter  2011   help to correct tremor   BRAIN TRANSMITTER  08/2019   brain transmitter     removal for infection 2023   BUNIONECTOMY  2001   CATARACT EXTRACTION, BILATERAL     COSMETIC SURGERY     CYSTECTOMY  1991   back   INNER EAR SURGERY  1972 and 1979   KNEE SURGERY     TUBAL LIGATION  1969    Current Outpatient Medications:    famotidine (PEPCID) 40 MG tablet, Take 40 mg by mouth daily., Disp: , Rfl:    JUBLIA 10 % SOLN, Apply topically., Disp: , Rfl:    triamcinolone cream (KENALOG) 0.1 %, SMARTSIG:Topical 1-2 Times Daily PRN, Disp: , Rfl:    ACCU-CHEK GUIDE test strip, , Disp: , Rfl:    aspirin 81 MG tablet, Take 81 mg by mouth daily.  , Disp: , Rfl:    dapagliflozin propanediol (FARXIGA) 10 MG TABS tablet, Take 1 tablet (10 mg total) by mouth daily before breakfast., Disp: 30 tablet, Rfl: 6   ezetimibe (ZETIA) 10 MG tablet, TAKE 1 TABLET BY MOUTH EVERY DAY, Disp: 15 tablet, Rfl: 0   fexofenadine (ALLEGRA) 180 MG tablet, Take 1 tablet (180 mg total) by mouth daily., Disp: 15  tablet, Rfl: 0   metFORMIN (GLUCOPHAGE-XR) 500 MG 24 hr tablet, Take 500 mg by mouth daily., Disp: , Rfl:    Multiple Vitamins-Minerals (CENTRUM SILVER PO), Take 3 tablets by mouth daily., Disp: , Rfl:    nystatin-triamcinolone ointment (MYCOLOG), Apply 1 Application topically 2 (two) times daily., Disp: 30 g, Rfl: 2   Omega-3 Fatty Acids (FISH OIL) 1200 MG CAPS, Take 1 capsule by mouth daily., Disp: , Rfl:    primidone (MYSOLINE) 50 MG tablet, Take 150 mg by mouth., Disp: , Rfl:    propranolol (INDERAL LA) 160 MG SR capsule, Take 160 mg by mouth daily. , Disp: , Rfl:    rosuvastatin (CRESTOR) 10 MG tablet, TAKE 1 TABLET BY MOUTH ONCE DAILY, Disp: 90 tablet, Rfl: 2   SUMAtriptan (IMITREX) 50 MG tablet, Take 50 mg by mouth as needed. , Disp: , Rfl:    traZODone (DESYREL) 50 MG tablet, Take 1 tablet (50 mg total) by mouth at bedtime., Disp: 90 tablet, Rfl: 2   VITAMIN D PO, Take 1 tablet by mouth daily., Disp: , Rfl:  No current facility-administered medications for this visit.  Facility-Administered Medications Ordered in Other  Visits:    regadenoson (LEXISCAN) injection SOLN 0.4 mg, 0.4 mg, Intravenous, Once, Jodelle Red, MD   technetium tetrofosmin (TC-MYOVIEW) injection 32.2 millicurie, 32.2 millicurie, Intravenous, Once PRN, Jodelle Red, MD  Allergies  Allergen Reactions   Cephalexin Nausea And Vomiting   Doxycycline Nausea And Vomiting   Naproxen Nausea And Vomiting   Review of Systems Objective:  There were no vitals filed for this visit.  General: Well developed, nourished, in no acute distress, alert and oriented x3   Dermatological: Skin is warm, dry and supple bilateral. Nails x 10 are well maintained; remaining integument appears unremarkable at this time. There are no open sores, no preulcerative lesions, no rash or signs of infection present.  Hallux nails are thick and sharply incurvated there appears to be some discoloration distally with subungual  debris.  Vascular: Dorsalis Pedis artery and Posterior Tibial artery pedal pulses are 2/4 bilateral with immedate capillary fill time. Pedal hair growth present. No varicosities and no lower extremity edema present bilateral.   Neruologic: Grossly intact via light touch bilateral. Vibratory intact via tuning fork bilateral. Protective threshold with Semmes Wienstein monofilament intact to all pedal sites bilateral. Patellar and Achilles deep tendon reflexes 2+ bilateral. No Babinski or clonus noted bilateral.   Musculoskeletal: No gross boney pedal deformities bilateral. No pain, crepitus, or limitation noted with foot and ankle range of motion bilateral. Muscular strength 5/5 in all groups tested bilateral.  Gait: Unassisted, Nonantalgic.    Radiographs:  None taken  Assessment & Plan:   Assessment: Ingrown toenails nail dystrophy hallux bilateral.  Onychocryptosis.    Plan: Took samples of the nail hallux bilateral.  Follow-up with her in 1 month may need to consider permanent removal of the margins particularly the hallux left     Raeshaun Simson T. [place], [place]

## 2023-04-21 ENCOUNTER — Other Ambulatory Visit: Payer: Self-pay | Admitting: Podiatry

## 2023-04-22 ENCOUNTER — Ambulatory Visit: Payer: Medicare Other | Admitting: Podiatry

## 2023-04-22 ENCOUNTER — Telehealth: Payer: Self-pay

## 2023-04-22 ENCOUNTER — Encounter: Payer: Self-pay | Admitting: *Deleted

## 2023-04-22 NOTE — Telephone Encounter (Signed)
Appt was cxled for today 11/12

## 2023-04-22 NOTE — Telephone Encounter (Signed)
Dr. Milinda Pointer has reviewed your fungal culture results and it shows no fungus, just trauma to the nails. He recommended that you try a urea nail gel (20% urea or higher) that will help thin the toenail out. Try to keep the nails cut short and filed thin. This will help the appearance of the toenails. If you have an appointment already scheduled, you can cancel it. No appointment is necessary at this time, unless you would like further discussion about the results.

## 2023-04-22 NOTE — Telephone Encounter (Signed)
This encounter was created in error - please disregard.

## 2023-04-29 ENCOUNTER — Encounter: Payer: Self-pay | Admitting: Cardiology

## 2023-04-29 ENCOUNTER — Ambulatory Visit: Payer: Medicare Other | Admitting: Cardiovascular Disease

## 2023-04-29 ENCOUNTER — Ambulatory Visit: Payer: Medicare Other | Attending: Cardiology | Admitting: Cardiology

## 2023-04-29 ENCOUNTER — Ambulatory Visit: Payer: Medicare Other | Admitting: Cardiology

## 2023-04-29 VITALS — BP 114/62 | HR 57 | Ht 63.0 in | Wt 163.0 lb

## 2023-04-29 DIAGNOSIS — I2510 Atherosclerotic heart disease of native coronary artery without angina pectoris: Secondary | ICD-10-CM

## 2023-04-29 DIAGNOSIS — M79604 Pain in right leg: Secondary | ICD-10-CM | POA: Diagnosis not present

## 2023-04-29 DIAGNOSIS — E782 Mixed hyperlipidemia: Secondary | ICD-10-CM

## 2023-04-29 DIAGNOSIS — M79605 Pain in left leg: Secondary | ICD-10-CM

## 2023-04-29 DIAGNOSIS — I10 Essential (primary) hypertension: Secondary | ICD-10-CM

## 2023-04-29 NOTE — Progress Notes (Signed)
Cardiology Office Note:    Date:  05/02/2023   ID:  Heidi Tucker, DOB January 26, 1941, MRN [id number]  PCP:  Thana Ates, MD  Cardiologist:  None  Electrophysiologist:  None   Referring MD: Eloisa Northern, MD     History of Present Illness:    Heidi Tucker is a 82 y.o. female with a hx of with history of arthritis, DM, HLD, mild dilation of ascending aorta, deep brain stimulator/tremors, PVCs and minimal CAD by CT in 2019 who presents to clinic for follow-up.   She has been followed in our clinic by Dr. Delton See and Dr. Shari Prows and this is my first visit with her.  She does not have any cardiovascular complaints. But tells me that she has been experiencing  'Charlie horse' in the morning when stretching her leg in bed, which is so painful that it takes her about five minutes to rub her leg. She also reports a spot of pain in her buttock, which she describes as new, but notes that she had similar pain about a month ago that radiated down to her leg and was so severe that she could hardly walk. At that time, she was advised to take ibuprofen, which improved her symptoms. She also reports that she sometimes feels pain when walking, which she attributes to the residual pain from the 'Charlie horse.' She notes that the pain is not consistent and does not occur every day.  Past Medical History:  Diagnosis Date   Arthritis    hands   Bilateral swelling of feet    Diabetes mellitus    Generalized headaches    Hearing loss    Hyperlipidemia    Mild CAD    Mild dilation of ascending aorta (HCC)    PVC's (premature ventricular contractions)    Status post deep brain stimulator placement    Wears glasses    Wears hearing aid     Past Surgical History:  Procedure Laterality Date   brain transmitter  2011   help to correct tremor   BRAIN TRANSMITTER  08/2019   brain transmitter     removal for infection 2023   BUNIONECTOMY  2001   CATARACT EXTRACTION, BILATERAL     COSMETIC SURGERY      CYSTECTOMY  1991   back   INNER EAR SURGERY  1972 and 1979   KNEE SURGERY     TUBAL LIGATION  1969    Current Medications: Current Meds  Medication Sig   ACCU-CHEK GUIDE test strip    aspirin 81 MG tablet Take 81 mg by mouth daily.     dapagliflozin propanediol (FARXIGA) 10 MG TABS tablet Take 1 tablet (10 mg total) by mouth daily before breakfast.   ezetimibe (ZETIA) 10 MG tablet TAKE 1 TABLET BY MOUTH EVERY DAY   famotidine (PEPCID) 40 MG tablet Take 40 mg by mouth daily.   fexofenadine (ALLEGRA) 180 MG tablet Take 1 tablet (180 mg total) by mouth daily.   JUBLIA 10 % SOLN Apply topically.   metFORMIN (GLUCOPHAGE-XR) 500 MG 24 hr tablet Take 500 mg by mouth daily.   Multiple Vitamins-Minerals (CENTRUM SILVER PO) Take 3 tablets by mouth daily.   nystatin-triamcinolone ointment (MYCOLOG) Apply 1 Application topically 2 (two) times daily.   Omega-3 Fatty Acids (FISH OIL) 1200 MG CAPS Take 1 capsule by mouth daily.   PRIMIDONE PO Take 150 mg by mouth.   propranolol (INDERAL LA) 160 MG SR capsule Take 160 mg by mouth daily.  rosuvastatin (CRESTOR) 10 MG tablet TAKE 1 TABLET BY MOUTH ONCE DAILY   SUMAtriptan (IMITREX) 50 MG tablet Take 50 mg by mouth as needed.    traZODone (DESYREL) 50 MG tablet Take 1 tablet (50 mg total) by mouth at bedtime.   triamcinolone cream (KENALOG) 0.1 % SMARTSIG:Topical 1-2 Times Daily PRN   VITAMIN D PO Take 1 tablet by mouth daily.     Allergies:   Cephalexin, Doxycycline, and Naproxen   Social History   Socioeconomic History   Marital status: Married    Spouse name: Not on file   Number of children: Not on file   Years of education: Not on file   Highest education level: Not on file  Occupational History   Not on file  Tobacco Use   Smoking status: Never   Smokeless tobacco: Never  Vaping Use   Vaping status: Never Used  Substance and Sexual Activity   Alcohol use: No   Drug use: No   Sexual activity: Not Currently    Partners: Male     Birth control/protection: Post-menopausal    Comment: 1ST INTERCOURSE- 5, PARTNERS- 2  Other Topics Concern   Not on file  Social History Narrative   Not on file   Social Determinants of Health   Financial Resource Strain: Not on file  Food Insecurity: Not on file  Transportation Needs: Not on file  Physical Activity: Not on file  Stress: Not on file  Social Connections: Not on file     Family History: The patient's family history includes Diabetes in her father and maternal aunt; Heart disease in her father. There is no history of Breast cancer.  ROS:   Review of Systems  Constitution: Negative for decreased appetite, fever and weight gain.  HENT: Negative for congestion, ear discharge, hoarse voice and sore throat.   Eyes: Negative for discharge, redness, vision loss in right eye and visual halos.  Cardiovascular: Negative for chest pain, dyspnea on exertion, leg swelling, orthopnea and palpitations.  Respiratory: Negative for cough, hemoptysis, shortness of breath and snoring.   Endocrine: Negative for heat intolerance and polyphagia.  Hematologic/Lymphatic: Negative for bleeding problem. Does not bruise/bleed easily.  Skin: Negative for flushing, nail changes, rash and suspicious lesions.  Musculoskeletal: Negative for arthritis, joint pain, muscle cramps, myalgias, neck pain and stiffness.  Gastrointestinal: Negative for abdominal pain, bowel incontinence, diarrhea and excessive appetite.  Genitourinary: Negative for decreased libido, genital sores and incomplete emptying.  Neurological: Negative for brief paralysis, focal weakness, headaches and loss of balance.  Psychiatric/Behavioral: Negative for altered mental status, depression and suicidal ideas.  Allergic/Immunologic: Negative for HIV exposure and persistent infections.    EKGs/Labs/Other Studies Reviewed:    The following studies were reviewed today:   EKG:  The ekg ordered today demonstrates sinus  bradycardia with first degree av block  Recent Labs: 09/24/2022: ALT 15; BUN 16; Creatinine, Ser 0.86; Potassium 4.5; Sodium 137  Recent Lipid Panel    Component Value Date/Time   CHOL 132 09/24/2022 1444   CHOL FAXED RESULT TO THE CLIENT_ 11/18/2014 1447   TRIG 160 (H) 09/24/2022 1444   TRIG FAXED RESULT TO THE CLIENT_ 11/18/2014 1447   HDL 57 09/24/2022 1444   HDL FAXED RESULT TO THE CLIENT_ 11/18/2014 1447   CHOLHDL 2.3 09/24/2022 1444   LDLCALC 48 09/24/2022 1444   LDLCALC FAXED RESULT TO THE CLIENT_ 11/18/2014 1447    Physical Exam:    VS:  BP 114/62 (BP Location: Left Arm, Patient  Position: Sitting, Cuff Size: Normal)   Pulse (!) 57   Ht 5\' 3"  (1.6 m)   Wt 163 lb (73.9 kg)   SpO2 97%   BMI 28.87 kg/m     Wt Readings from Last 3 Encounters:  04/29/23 163 lb (73.9 kg)  11/05/22 167 lb (75.8 kg)  10/21/22 170 lb 3.2 oz (77.2 kg)     GEN: Well nourished, well developed in no acute distress HEENT: Normal NECK: No JVD; No carotid bruits LYMPHATICS: No lymphadenopathy CARDIAC: S1S2 noted,RRR, no murmurs, rubs, gallops RESPIRATORY:  Clear to auscultation without rales, wheezing or rhonchi  ABDOMEN: Soft, non-tender, non-distended, +bowel sounds, no guarding. EXTREMITIES: No edema, No cyanosis, no clubbing MUSCULOSKELETAL:  No deformity  SKIN: Warm and dry NEUROLOGIC:  Alert and oriented x 3, non-focal PSYCHIATRIC:  Normal affect, good insight  ASSESSMENT:    1. Essential hypertension   2. Leg pain, bilateral   3. Mild CAD   4. Mixed hyperlipidemia    PLAN:    CAD - no anginal symptoms. Continue with Aspirin and statin  HTN - blood pressure at target, continue current medication regimen HLN - continue statin  Leg pain suspect sciatica - being managed by her pcp but with some symptoms suspected for claudication will get ABI   The patient is in agreement with the above plan. The patient left the office in stable condition.  The patient will follow up  in   Medication Adjustments/Labs and Tests Ordered: Current medicines are reviewed at length with the patient today.  Concerns regarding medicines are outlined above.  Orders Placed This Encounter  Procedures   EKG 12-Lead   VAS [place] ABI WITH/WO TBI   No orders of the defined types were placed in this encounter.   Patient Instructions  Medication Instructions:  Your physician recommends that you continue on your current medications as directed. Please refer to the Current Medication list given to you today.  *If you need a refill on your cardiac medications before your next appointment, please call your pharmacy*    Testing/Procedures: Your physician has requested that you have an ankle brachial index (ABI). During this test an ultrasound and blood pressure cuff are used to evaluate the arteries that supply the arms and legs with blood. Allow thirty minutes for this exam. There are no restrictions or special instructions.  Please note: We ask at that you not bring children with you during ultrasound (echo/ vascular) testing. Due to room size and safety concerns, children are not allowed in the ultrasound rooms during exams. Our front office staff cannot provide observation of children in our lobby area while testing is being conducted. An adult accompanying a patient to their appointment will only be allowed in the ultrasound room at the discretion of the ultrasound technician under special circumstances. We apologize for any inconvenience.    Follow-Up: At [facility], you and your health needs are our priority.  As part of our continuing mission to provide you with exceptional heart care, we have created designated Provider Care Teams.  These Care Teams include your primary Cardiologist (physician) and Advanced Practice Providers (APPs -  Physician Assistants and Nurse Practitioners) who all work together to provide you with the care you need, when you need it.   Your next  appointment:   6 month(s)  Provider:   Thomasene Ripple, DO     Adopting a Healthy Lifestyle.  Know what a healthy weight is for you (roughly BMI <25) and aim  to maintain this   Aim for 7+ servings of fruits and vegetables daily   65-80+ fluid ounces of water or unsweet tea for healthy kidneys   Limit to max 1 drink of alcohol per day; avoid smoking/tobacco   Limit animal fats in diet for cholesterol and heart health - choose grass fed whenever available   Avoid highly processed foods, and foods high in saturated/trans fats   Aim for low stress - take time to unwind and care for your mental health   Aim for 150 min of moderate intensity exercise weekly for heart health, and weights twice weekly for bone health   Aim for 7-9 hours of sleep daily   When it comes to diets, agreement about the perfect plan isnt easy to find, even among the experts. Experts at the [facility] [employer] developed an idea known as the Healthy Eating Plate. Just imagine a plate divided into logical, healthy portions.   The emphasis is on diet quality:   Load up on vegetables and fruits - one-half of your plate: Aim for color and variety, and remember that potatoes dont count.   Go for whole grains - one-quarter of your plate: Whole wheat, barley, wheat berries, quinoa, oats, brown rice, and foods made with them. If you want pasta, go with whole wheat pasta.   Protein power - one-quarter of your plate: Fish, chicken, beans, and nuts are all healthy, versatile protein sources. Limit red meat.   The diet, however, does go beyond the plate, offering a few other suggestions.   Use healthy plant oils, such as olive, canola, soy, corn, sunflower and peanut. Check the labels, and avoid partially hydrogenated oil, which have unhealthy trans fats.   If youre thirsty, drink water. Coffee and tea are good in moderation, but skip sugary drinks and limit milk and dairy products to one or two daily  servings.   The type of carbohydrate in the diet is more important than the amount. Some sources of carbohydrates, such as vegetables, fruits, whole grains, and beans-are healthier than others.   Finally, stay active  Signed, Thomasene Ripple, DO  05/02/2023 7:45 PM    [facility]

## 2023-04-29 NOTE — Patient Instructions (Signed)
Medication Instructions:  Your physician recommends that you continue on your current medications as directed. Please refer to the Current Medication list given to you today.  *If you need a refill on your cardiac medications before your next appointment, please call your pharmacy*    Testing/Procedures: Your physician has requested that you have an ankle brachial index (ABI). During this test an ultrasound and blood pressure cuff are used to evaluate the arteries that supply the arms and legs with blood. Allow thirty minutes for this exam. There are no restrictions or special instructions.  Please note: We ask at that you not bring children with you during ultrasound (echo/ vascular) testing. Due to room size and safety concerns, children are not allowed in the ultrasound rooms during exams. Our front office staff cannot provide observation of children in our lobby area while testing is being conducted. An adult accompanying a patient to their appointment will only be allowed in the ultrasound room at the discretion of the ultrasound technician under special circumstances. We apologize for any inconvenience.    Follow-Up: At [facility], you and your health needs are our priority.  As part of our continuing mission to provide you with exceptional heart care, we have created designated Provider Care Teams.  These Care Teams include your primary Cardiologist (physician) and Advanced Practice Providers (APPs -  Physician Assistants and Nurse Practitioners) who all work together to provide you with the care you need, when you need it.   Your next appointment:   6 month(s)  Provider:   Thomasene Ripple, DO

## 2023-05-05 ENCOUNTER — Other Ambulatory Visit: Payer: Self-pay

## 2023-05-05 MED ORDER — DAPAGLIFLOZIN PROPANEDIOL 10 MG PO TABS: 10.0000 mg | ORAL_TABLET | Freq: Every day | ORAL | 6 refills | Status: AC

## 2023-05-12 ENCOUNTER — Other Ambulatory Visit: Payer: Self-pay | Admitting: Internal Medicine

## 2023-05-16 ENCOUNTER — Ambulatory Visit (HOSPITAL_COMMUNITY)
Admission: RE | Admit: 2023-05-16 | Discharge: 2023-05-16 | Disposition: A | Discharge status: Home / Self Care | Payer: Medicare Other | Source: Ambulatory Visit | Attending: Cardiology | Admitting: Cardiology

## 2023-05-16 DIAGNOSIS — M79604 Pain in right leg: Secondary | ICD-10-CM | POA: Insufficient documentation

## 2023-05-16 DIAGNOSIS — M79605 Pain in left leg: Secondary | ICD-10-CM | POA: Diagnosis present

## 2023-05-16 LAB — VAS US ABI WITH/WO TBI
Left ABI: 1.1
Right ABI: 1.11

## 2023-05-30 ENCOUNTER — Other Ambulatory Visit: Payer: Self-pay | Admitting: Internal Medicine

## 2023-06-26 ENCOUNTER — Other Ambulatory Visit: Payer: Self-pay

## 2023-06-26 MED ORDER — TRIAMCINOLONE ACETONIDE 0.1 % EX CREA: TOPICAL_CREAM | CUTANEOUS | 0 refills | Status: DC | PRN

## 2023-06-26 NOTE — Telephone Encounter (Signed)
Pt has appt 09/08/23 with [facility]. Requesting refill of Triamcinolone

## 2023-09-08 ENCOUNTER — Encounter: Payer: Medicare Other | Admitting: Obstetrics and Gynecology

## 2023-09-16 ENCOUNTER — Encounter: Admitting: Obstetrics and Gynecology

## 2023-10-30 ENCOUNTER — Ambulatory Visit: Payer: Self-pay | Admitting: Obstetrics and Gynecology

## 2023-10-30 ENCOUNTER — Ambulatory Visit (INDEPENDENT_AMBULATORY_CARE_PROVIDER_SITE_OTHER): Admitting: Obstetrics and Gynecology

## 2023-10-30 ENCOUNTER — Encounter: Payer: Self-pay | Admitting: Obstetrics and Gynecology

## 2023-10-30 VITALS — BP 110/74 | HR 64 | Ht 61.5 in | Wt 159.0 lb

## 2023-10-30 DIAGNOSIS — Z1211 Encounter for screening for malignant neoplasm of colon: Secondary | ICD-10-CM

## 2023-10-30 DIAGNOSIS — R159 Full incontinence of feces: Secondary | ICD-10-CM | POA: Diagnosis not present

## 2023-10-30 DIAGNOSIS — N8111 Cystocele, midline: Secondary | ICD-10-CM | POA: Diagnosis not present

## 2023-10-30 DIAGNOSIS — M810 Age-related osteoporosis without current pathological fracture: Secondary | ICD-10-CM | POA: Diagnosis not present

## 2023-10-30 DIAGNOSIS — N393 Stress incontinence (female) (male): Secondary | ICD-10-CM

## 2023-10-30 DIAGNOSIS — Z01419 Encounter for gynecological examination (general) (routine) without abnormal findings: Secondary | ICD-10-CM

## 2023-10-30 MED ORDER — TRIAMCINOLONE ACETONIDE 0.1 % EX CREA: TOPICAL_CREAM | CUTANEOUS | 0 refills | Status: DC | PRN

## 2023-10-30 MED ORDER — TRIAMCINOLONE ACETONIDE 0.1 % EX CREA: 1.0000 | TOPICAL_CREAM | Freq: Two times a day (BID) | CUTANEOUS | 0 refills | Status: DC

## 2023-10-30 MED ORDER — ESTRADIOL 0.1 MG/GM VA CREA: 1.0000 | TOPICAL_CREAM | Freq: Every day | VAGINAL | 12 refills | Status: DC

## 2023-10-30 MED ORDER — ROMOSOZUMAB-AQQG 105 MG/1.17ML ~~LOC~~ SOSY: 210.0000 mg | PREFILLED_SYRINGE | Freq: Once | SUBCUTANEOUS | Status: AC

## 2023-10-30 NOTE — Progress Notes (Signed)
 83 y.o. y.o. female here for annual medicare gyn exam No LMP recorded. Patient is postmenopausal.    G2P2L2  Married.  From [place], speaks [place] Reads lips. Hard of hearing   RP:  Established patient presenting for annual gyn exam    HPI:  Postmenopause.  No HRT.  No PMB.  No Pelvic pain.  Abstinent.  Pap Neg in 2018.  Breasts wnl. Mammo scheduled. Urine normal.  Stable SUI. BMs wnl.  BMI 31.64. BD Left Femur Neck Osteopenia T-Score -1.8 in 03/2019. Dxa in 2023 -2.0 at left femur neck with positive frax 15%, 4.4%.  Counseled with osteopenia with positive frax she needs treatment and she declines fosamax with reflux. Counseled on evenity and prolia and she would like to see if she would qualify for evenity or prolia, but would like the bone building with evenity first. Counseled she will need calcium  and vit D. Labs sent. Good fitness and healthy nutrition. Colono Neg in 2016. Health labs with Fam MD.    Body mass index is 29.56 kg/m.      No data to display          Blood pressure 110/74, pulse 64, height 5' 1.5" (1.562 m), weight 159 lb (72.1 kg), SpO2 98%.  No results found for: "DIAGPAP", "HPVHIGH", "ADEQPAP"  GYN HISTORY: No results found for: "DIAGPAP", "HPVHIGH", "ADEQPAP"  OB History  Gravida Para Term Preterm AB Living  2 2    2   SAB IAB Ectopic Multiple Live Births          # Outcome Date GA Lbr Len/2nd Weight Sex Type Anes PTL Lv  2 Para           1 Para             Past Medical History:  Diagnosis Date   Arthritis    hands   Bilateral swelling of feet    Diabetes mellitus    Generalized headaches    Hearing loss    Hyperlipidemia    Mild CAD    Mild dilation of ascending aorta (HCC)    PVC's (premature ventricular contractions)    Status post deep brain stimulator placement    Wears glasses    Wears hearing aid     Past Surgical History:  Procedure Laterality Date   brain transmitter  2011   help to correct tremor   BRAIN TRANSMITTER   08/2019   brain transmitter     removal for infection 2023   BUNIONECTOMY  2001   CATARACT EXTRACTION, BILATERAL     COSMETIC SURGERY     CYSTECTOMY  1991   back   INNER EAR SURGERY  1972 and 1979   KNEE SURGERY     TUBAL LIGATION  1969    Current Outpatient Medications on File Prior to Visit  Medication Sig Dispense Refill   aspirin 81 MG tablet Take 81 mg by mouth daily.       dapagliflozin  propanediol (FARXIGA ) 10 MG TABS tablet Take 1 tablet (10 mg total) by mouth daily before breakfast. 30 tablet 6   ezetimibe  (ZETIA ) 10 MG tablet TAKE 1 TABLET BY MOUTH EVERY DAY 15 tablet 0   famotidine (PEPCID) 40 MG tablet Take 40 mg by mouth daily.     JUBLIA 10 % SOLN Apply topically.     metFORMIN (GLUCOPHAGE-XR) 500 MG 24 hr tablet Take 500 mg by mouth daily.     Multiple Vitamins-Minerals (CENTRUM SILVER PO) Take 3 tablets by  mouth daily.     Omega-3 Fatty Acids (FISH OIL) 1200 MG CAPS Take 1 capsule by mouth daily.     PRIMIDONE PO Take 150 mg by mouth.     propranolol (INDERAL LA) 160 MG SR capsule Take 160 mg by mouth daily.      rosuvastatin  (CRESTOR ) 10 MG tablet TAKE 1 TABLET BY MOUTH ONCE DAILY 90 tablet 2   SUMAtriptan (IMITREX) 50 MG tablet Take 50 mg by mouth as needed.      traZODone  (DESYREL ) 50 MG tablet Take 1 tablet (50 mg total) by mouth at bedtime. 90 tablet 2   VITAMIN D PO Take 1 tablet by mouth daily.     nystatin -triamcinolone  ointment (MYCOLOG) Apply 1 Application topically 2 (two) times daily. (Patient not taking: Reported on 10/30/2023) 30 g 2   Current Facility-Administered Medications on File Prior to Visit  Medication Dose Route Frequency Provider Last Rate Last Admin   regadenoson  (LEXISCAN ) injection SOLN 0.4 mg  0.4 mg Intravenous Once Sheryle Donning, MD       technetium tetrofosmin  (TC-MYOVIEW ) injection 32.2 millicurie  32.2 millicurie Intravenous Once PRN Sheryle Donning, MD        Social History   Socioeconomic History   Marital  status: Married    Spouse name: Not on file   Number of children: Not on file   Years of education: Not on file   Highest education level: Not on file  Occupational History   Not on file  Tobacco Use   Smoking status: Never   Smokeless tobacco: Never  Vaping Use   Vaping status: Never Used  Substance and Sexual Activity   Alcohol use: No   Drug use: No   Sexual activity: Not Currently    Partners: Male    Birth control/protection: Post-menopausal    Comment: 1ST INTERCOURSE- 44, PARTNERS- 2  Other Topics Concern   Not on file  Social History Narrative   Not on file   Social Drivers of Health   Financial Resource Strain: Not on file  Food Insecurity: Not on file  Transportation Needs: Not on file  Physical Activity: Not on file  Stress: Not on file  Social Connections: Not on file  Intimate Partner Violence: Not on file    Family History  Problem Relation Age of Onset   Heart disease Father        heart attack   Diabetes Father    Diabetes Maternal Aunt    Breast cancer Neg Hx      Allergies  Allergen Reactions   Nitrofurantoin Monohyd Macro     Other Reaction(s): headache, chills, fatigue   Cephalexin Nausea And Vomiting   Doxycycline Nausea And Vomiting   Naproxen     Aleve headache      Patient's last menstrual period was No LMP recorded. Patient is postmenopausal..             Review of Systems Alls systems reviewed and are negative.     Physical Exam Constitutional:      Appearance: Normal appearance.  Genitourinary:     Vulva and urethral meatus normal.     No lesions in the vagina.     Right Labia: No rash, lesions or skin changes.    Left Labia: No lesions, skin changes or rash.    No vaginal discharge or tenderness.     No vaginal prolapse present.    Severe vaginal atrophy present.     Right Adnexa: not tender, not  palpable and no mass present.    Left Adnexa: not tender, not palpable and no mass present.    No cervical motion  tenderness or discharge.     Uterus is not enlarged, tender or irregular.  Breasts:    Right: Normal.     Left: Normal.  HENT:     Head: Normocephalic.  Neck:     Thyroid : No thyroid  mass, thyromegaly or thyroid  tenderness.  Cardiovascular:     Rate and Rhythm: Normal rate and regular rhythm.     Heart sounds: Normal heart sounds, S1 normal and S2 normal.  Pulmonary:     Effort: Pulmonary effort is normal.     Breath sounds: Normal breath sounds and air entry.  Abdominal:     General: There is no distension.     Palpations: Abdomen is soft. There is no mass.     Tenderness: There is no abdominal tenderness. There is no guarding or rebound.  Musculoskeletal:        General: Normal range of motion.     Cervical back: Full passive range of motion without pain, normal range of motion and neck supple. No tenderness.     Right lower leg: No edema.     Left lower leg: No edema.  Neurological:     Mental Status: She is alert.  Skin:    General: Skin is warm.  Psychiatric:        Mood and Affect: Mood normal.        Behavior: Behavior normal.        Thought Content: Thought content normal.  Vitals and nursing note reviewed. Exam conducted with a chaperone present.    [name], CMA was present during the exam    A:         Well Woman GYN exam, osteopenia with positive frax score needed bone support in the left hip                             P:        Pap smear not indicated Encouraged annual mammogram screening Colon cancer screening aged out DXA ordered today would still recommend treatment.  Counseled on all options and the r/b/a/I of each were reviewed.  Counseled on the risks without treatment of fracture and subsequent fractures. . Discussed r/b/a/I of evenity and prolia.  She is interested in starting evenity and would like the bone building. Labs and immunizations ordered today Encouraged healthy lifestyle practices Encouraged Vit D and Calcium   Cystocele: referral placed to  urogyn for management 20 minutes spent on reviewing records, imaging,  and one on one patient time and counseling patient and documentation Dr. Tia Flowers  No follow-ups on file.  Reinaldo Caras

## 2023-10-31 LAB — LIPID PANEL
Cholesterol: 174 mg/dL (ref <200)
HDL: 61 mg/dL (ref >=50)
LDL Cholesterol (Calc): 85 mg/dL
Non-HDL Cholesterol (Calc): 113 mg/dL (ref <130)
Total CHOL/HDL Ratio: 2.9 (calc) (ref <5.0)
Triglycerides: 180 mg/dL — ABNORMAL HIGH (ref <150)

## 2023-10-31 LAB — CBC
HCT: 41.8 % (ref 35.0–45.0)
Hemoglobin: 13.6 g/dL (ref 11.7–15.5)
MCH: 30.2 pg (ref 27.0–33.0)
MCHC: 32.5 g/dL (ref 32.0–36.0)
MCV: 92.7 fL (ref 80.0–100.0)
MPV: 11.5 fL (ref 7.5–12.5)
Platelets: 199 10*3/uL (ref 140–400)
RBC: 4.51 10*6/uL (ref 3.80–5.10)
RDW: 11.9 % (ref 11.0–15.0)
WBC: 5.7 10*3/uL (ref 3.8–10.8)

## 2023-10-31 LAB — HEMOGLOBIN A1C
Hgb A1c MFr Bld: 6.8 % — ABNORMAL HIGH (ref <5.7)
Mean Plasma Glucose: 148 mg/dL
eAG (mmol/L): 8.2 mmol/L

## 2023-10-31 LAB — COMPREHENSIVE METABOLIC PANEL WITH GFR
AG Ratio: 1.8 (calc) (ref 1.0–2.5)
ALT: 15 U/L (ref 6–29)
AST: 17 U/L (ref 10–35)
Albumin: 4.3 g/dL (ref 3.6–5.1)
Alkaline phosphatase (APISO): 71 U/L (ref 37–153)
BUN: 19 mg/dL (ref 7–25)
CO2: 31 mmol/L (ref 20–32)
Calcium: 9.5 mg/dL (ref 8.6–10.4)
Chloride: 101 mmol/L (ref 98–110)
Creat: 0.76 mg/dL (ref 0.60–0.95)
Globulin: 2.4 g/dL (ref 1.9–3.7)
Glucose, Bld: 112 mg/dL — ABNORMAL HIGH (ref 65–99)
Potassium: 5.2 mmol/L (ref 3.5–5.3)
Sodium: 137 mmol/L (ref 135–146)
Total Bilirubin: 0.3 mg/dL (ref 0.2–1.2)
Total Protein: 6.7 g/dL (ref 6.1–8.1)
eGFR: 78 mL/min/{1.73_m2} (ref >=60)

## 2023-10-31 LAB — VITAMIN D 25 HYDROXY (VIT D DEFICIENCY, FRACTURES): Vit D, 25-Hydroxy: 34 ng/mL (ref 30–100)

## 2023-10-31 LAB — TSH: TSH: 2.18 m[IU]/L (ref 0.40–4.50)

## 2023-11-03 NOTE — Addendum Note (Signed)
 Addended by: Reinaldo Caras on: 11/03/2023 04:09 PM   Modules accepted: Level of Service

## 2023-11-06 ENCOUNTER — Telehealth: Payer: Self-pay | Admitting: *Deleted

## 2023-11-06 NOTE — Telephone Encounter (Signed)
 Call to patient. Patient advised summary of benefits for Evenity received from [facility]. Advised patient [employer] requires 20% coinsurance for Evenity each month which estimated out of pocket monthly would be $490. Patient states she does not want to pay $490 a month unless she desperately needs medication. Patient would like to schedule DEXA and decide once results are back. Patient does MMG at [facility] and is familiar with the facility. RN provided contact information to call and schedule appointment for DEXA at her convenience. Patient verbalized understanding. Patient aware once results of DEXA back and reviewed by Dr. Tia Flowers, she could make a decision on what route of treatment she would like to take.   Routing to provider as Arlie Lain.

## 2023-11-07 NOTE — Telephone Encounter (Signed)
 Patient has BMD scheduled for 12/04/23. Will await results.   Encounter closed.

## 2023-12-04 ENCOUNTER — Ambulatory Visit (HOSPITAL_BASED_OUTPATIENT_CLINIC_OR_DEPARTMENT_OTHER)
Admission: RE | Admit: 2023-12-04 | Discharge: 2023-12-04 | Disposition: A | Discharge status: Home / Self Care | Source: Ambulatory Visit | Attending: Obstetrics and Gynecology | Admitting: Obstetrics and Gynecology

## 2023-12-04 DIAGNOSIS — M810 Age-related osteoporosis without current pathological fracture: Secondary | ICD-10-CM | POA: Insufficient documentation

## 2024-01-02 ENCOUNTER — Other Ambulatory Visit: Payer: Self-pay | Admitting: Obstetrics and Gynecology

## 2024-01-02 ENCOUNTER — Other Ambulatory Visit: Payer: Self-pay | Admitting: Internal Medicine

## 2024-01-02 NOTE — Telephone Encounter (Signed)
 Medication refill request: triamcinolone  cream 0.1% Last AEX:  10-30-23 Next AEX: not scheduled Last MMG (if hormonal medication request): n/a Refill authorized: please approve if appropriate

## 2024-01-06 ENCOUNTER — Other Ambulatory Visit: Payer: Self-pay | Admitting: Internal Medicine

## 2024-01-07 ENCOUNTER — Ambulatory Visit: Attending: Cardiology | Admitting: Cardiology

## 2024-01-07 ENCOUNTER — Encounter: Payer: Self-pay | Admitting: Cardiology

## 2024-01-07 VITALS — BP 130/80 | HR 64 | Ht 63.0 in | Wt 160.0 lb

## 2024-01-07 DIAGNOSIS — E785 Hyperlipidemia, unspecified: Secondary | ICD-10-CM

## 2024-01-07 DIAGNOSIS — I10 Essential (primary) hypertension: Secondary | ICD-10-CM

## 2024-01-07 NOTE — Patient Instructions (Signed)
 Medication Instructions:  Your physician recommends that you continue on your current medications as directed. Please refer to the Current Medication list given to you today.  *If you need a refill on your cardiac medications before your next appointment, please call your pharmacy*   Follow-Up: At [facility], you and your health needs are our priority.  As part of our continuing mission to provide you with exceptional heart care, our providers are all part of one team.  This team includes your primary Cardiologist (physician) and Advanced Practice Providers or APPs (Physician Assistants and Nurse Practitioners) who all work together to provide you with the care you need, when you need it.  Your next appointment:   2 year(s)  Provider:   Kardie Tobb, DO

## 2024-01-13 NOTE — Progress Notes (Signed)
 Cardiology Office Note:    Date:  01/13/2024   ID:  Marko Burkitt, DOB Feb 21, 1941, MRN [id number]  PCP:  Dwight Trula SQUIBB, MD  Cardiologist:  Dub Huntsman, DO  Electrophysiologist:  None   Referring MD: Dwight Trula SQUIBB, MD     History of Present Illness:    Heidi Tucker is a 83 y.o. female with a hx of with history of arthritis, DM, HLD, mild dilation of ascending aorta, deep brain stimulator/tremors, PVCs  who presents to clinic for follow-up.   Since her last visit with me she has been doing well. No complaints.   Past Medical History:  Diagnosis Date   Arthritis    hands   Bilateral swelling of feet    Diabetes mellitus    Generalized headaches    Hearing loss    Hyperlipidemia    Mild CAD    Mild dilation of ascending aorta (HCC)    PVC's (premature ventricular contractions)    Status post deep brain stimulator placement    Wears glasses    Wears hearing aid     Past Surgical History:  Procedure Laterality Date   brain transmitter  2011   help to correct tremor   BRAIN TRANSMITTER  08/2019   brain transmitter     removal for infection 2023   BUNIONECTOMY  2001   CATARACT EXTRACTION, BILATERAL     COSMETIC SURGERY     CYSTECTOMY  1991   back   INNER EAR SURGERY  1972 and 1979   KNEE SURGERY     TUBAL LIGATION  1969    Current Medications: Current Meds  Medication Sig   aspirin 81 MG tablet Take 81 mg by mouth daily.     dapagliflozin  propanediol (FARXIGA ) 10 MG TABS tablet Take 1 tablet (10 mg total) by mouth daily before breakfast.   estradiol  (ESTRACE  VAGINAL) 0.1 MG/GM vaginal cream Place 1 Applicatorful vaginally at bedtime. Rub a dime size amount into the vagina every night for 3 weeks then decrease to 3 times a week after   ezetimibe  (ZETIA ) 10 MG tablet TAKE 1 TABLET BY MOUTH EVERY DAY   famotidine (PEPCID) 40 MG tablet Take 40 mg by mouth daily.   JUBLIA 10 % SOLN Apply topically.   metFORMIN (GLUCOPHAGE-XR) 500 MG 24 hr tablet Take 500 mg by  mouth daily.   Multiple Vitamins-Minerals (CENTRUM SILVER PO) Take 3 tablets by mouth daily.   nystatin -triamcinolone  ointment (MYCOLOG) Apply 1 Application topically 2 (two) times daily.   Omega-3 Fatty Acids (FISH OIL) 1200 MG CAPS Take 1 capsule by mouth daily.   PRIMIDONE PO Take 150 mg by mouth.   propranolol (INDERAL LA) 160 MG SR capsule Take 160 mg by mouth daily.    rosuvastatin  (CRESTOR ) 10 MG tablet TAKE 1 TABLET BY MOUTH ONCE DAILY   SUMAtriptan (IMITREX) 50 MG tablet Take 50 mg by mouth as needed.    traZODone  (DESYREL ) 50 MG tablet Take 1 tablet (50 mg total) by mouth at bedtime.   triamcinolone  cream (KENALOG ) 0.1 % APPLY TO AFFECTED AREA EVERY DAY AS NEEDED   VITAMIN D  PO Take 1 tablet by mouth daily.   Current Facility-Administered Medications for the 01/07/24 encounter (Office Visit) with Aeriana Speece, DO  Medication   Romosozumab -aqqg (EVENITY ) 105 MG/1. injection 210 mg     Allergies:   Nitrofurantoin monohyd macro, Cephalexin, Doxycycline, and Naproxen   Social History   Socioeconomic History   Marital status: Married    Spouse name:  Not on file   Number of children: Not on file   Years of education: Not on file   Highest education level: Not on file  Occupational History   Not on file  Tobacco Use   Smoking status: Never   Smokeless tobacco: Never  Vaping Use   Vaping status: Never Used  Substance and Sexual Activity   Alcohol use: No   Drug use: No   Sexual activity: Not Currently    Partners: Male    Birth control/protection: Post-menopausal    Comment: 1ST INTERCOURSE- 8, PARTNERS- 2  Other Topics Concern   Not on file  Social History Narrative   Not on file   Social Drivers of Health   Financial Resource Strain: Not on file  Food Insecurity: Not on file  Transportation Needs: Not on file  Physical Activity: Not on file  Stress: Not on file  Social Connections: Not on file     Family History: The patient's family history includes  Diabetes in her father and maternal aunt; Heart disease in her father. There is no history of Breast cancer.  ROS:   Review of Systems  Constitution: Negative for decreased appetite, fever and weight gain.  HENT: Negative for congestion, ear discharge, hoarse voice and sore throat.   Eyes: Negative for discharge, redness, vision loss in right eye and visual halos.  Cardiovascular: Negative for chest pain, dyspnea on exertion, leg swelling, orthopnea and palpitations.  Respiratory: Negative for cough, hemoptysis, shortness of breath and snoring.   Endocrine: Negative for heat intolerance and polyphagia.  Hematologic/Lymphatic: Negative for bleeding problem. Does not bruise/bleed easily.  Skin: Negative for flushing, nail changes, rash and suspicious lesions.  Musculoskeletal: Negative for arthritis, joint pain, muscle cramps, myalgias, neck pain and stiffness.  Gastrointestinal: Negative for abdominal pain, bowel incontinence, diarrhea and excessive appetite.  Genitourinary: Negative for decreased libido, genital sores and incomplete emptying.  Neurological: Negative for brief paralysis, focal weakness, headaches and loss of balance.  Psychiatric/Behavioral: Negative for altered mental status, depression and suicidal ideas.  Allergic/Immunologic: Negative for HIV exposure and persistent infections.    EKGs/Labs/Other Studies Reviewed:    The following studies were reviewed today:   EKG:  The ekg ordered today demonstrates sinus bradycardia with first degree av block  Recent Labs: 10/30/2023: ALT 15; BUN 19; Creat 0.76; Hemoglobin 13.6; Platelets 199; Potassium 5.2; Sodium 137; TSH 2.18  Recent Lipid Panel    Component Value Date/Time   CHOL 174 10/30/2023 1010   CHOL 132 09/24/2022 1444   CHOL FAXED RESULT TO THE CLIENT_ 11/18/2014 1447   TRIG 180 (H) 10/30/2023 1010   TRIG FAXED RESULT TO THE CLIENT_ 11/18/2014 1447   HDL 61 10/30/2023 1010   HDL 57 09/24/2022 1444   HDL FAXED  RESULT TO THE CLIENT_ 11/18/2014 1447   CHOLHDL 2.9 10/30/2023 1010   LDLCALC 85 10/30/2023 1010   LDLCALC FAXED RESULT TO THE CLIENT_ 11/18/2014 1447    Physical Exam:    VS:  BP 130/80 (BP Location: Right Arm, Patient Position: Sitting, Cuff Size: Normal)   Pulse 64   Ht 5' 3 (1.6 m)   Wt 160 lb (72.6 kg)   SpO2 97%   BMI 28.34 kg/m     Wt Readings from Last 3 Encounters:  01/07/24 160 lb (72.6 kg)  10/30/23 159 lb (72.1 kg)  04/29/23 163 lb (73.9 kg)     GEN: Well nourished, well developed in no acute distress HEENT: Normal NECK: No JVD; No  carotid bruits LYMPHATICS: No lymphadenopathy CARDIAC: S1S2 noted,RRR, no murmurs, rubs, gallops RESPIRATORY:  Clear to auscultation without rales, wheezing or rhonchi  ABDOMEN: Soft, non-tender, non-distended, +bowel sounds, no guarding. EXTREMITIES: No edema, No cyanosis, no clubbing MUSCULOSKELETAL:  No deformity  SKIN: Warm and dry NEUROLOGIC:  Alert and oriented x 3, non-focal PSYCHIATRIC:  Normal affect, good insight  ASSESSMENT:    1. Hyperlipidemia LDL goal <70   2. Essential hypertension     PLAN:    HTN - blood pressure at target, continue current medication regimen HLN - continue statin    The patient is in agreement with the above plan. The patient left the office in stable condition.  She can follow up as needed   Medication Adjustments/Labs and Tests Ordered: Current medicines are reviewed at length with the patient today.  Concerns regarding medicines are outlined above.  No orders of the defined types were placed in this encounter.  No orders of the defined types were placed in this encounter.   Patient Instructions  Medication Instructions:  Your physician recommends that you continue on your current medications as directed. Please refer to the Current Medication list given to you today.  *If you need a refill on your cardiac medications before your next appointment, please call your  pharmacy*   Follow-Up: At [facility], you and your health needs are our priority.  As part of our continuing mission to provide you with exceptional heart care, our providers are all part of one team.  This team includes your primary Cardiologist (physician) and Advanced Practice Providers or APPs (Physician Assistants and Nurse Practitioners) who all work together to provide you with the care you need, when you need it.  Your next appointment:   2 year(s)  Provider:   Gayathri Futrell, DO       Adopting a Healthy Lifestyle.  Know what a healthy weight is for you (roughly BMI <25) and aim to maintain this   Aim for 7+ servings of fruits and vegetables daily   65-80+ fluid ounces of water or unsweet tea for healthy kidneys   Limit to max 1 drink of alcohol per day; avoid smoking/tobacco   Limit animal fats in diet for cholesterol and heart health - choose grass fed whenever available   Avoid highly processed foods, and foods high in saturated/trans fats   Aim for low stress - take time to unwind and care for your mental health   Aim for 150 min of moderate intensity exercise weekly for heart health, and weights twice weekly for bone health   Aim for 7-9 hours of sleep daily   When it comes to diets, agreement about the perfect plan isnt easy to find, even among the experts. Experts at the [facility] [employer] developed an idea known as the Healthy Eating Plate. Just imagine a plate divided into logical, healthy portions.   The emphasis is on diet quality:   Load up on vegetables and fruits - one-half of your plate: Aim for color and variety, and remember that potatoes dont count.   Go for whole grains - one-quarter of your plate: Whole wheat, barley, wheat berries, quinoa, oats, brown rice, and foods made with them. If you want pasta, go with whole wheat pasta.   Protein power - one-quarter of your plate: Fish, chicken, beans, and nuts are all healthy,  versatile protein sources. Limit red meat.   The diet, however, does go beyond the plate, offering a few  other suggestions.   Use healthy plant oils, such as olive, canola, soy, corn, sunflower and peanut. Check the labels, and avoid partially hydrogenated oil, which have unhealthy trans fats.   If youre thirsty, drink water. Coffee and tea are good in moderation, but skip sugary drinks and limit milk and dairy products to one or two daily servings.   The type of carbohydrate in the diet is more important than the amount. Some sources of carbohydrates, such as vegetables, fruits, whole grains, and beans-are healthier than others.   Finally, stay active  Signed, Sayuri Rhames, DO  01/13/2024 10:56 AM    [facility]

## 2024-02-02 ENCOUNTER — Other Ambulatory Visit: Payer: Self-pay | Admitting: Obstetrics and Gynecology

## 2024-02-02 NOTE — Telephone Encounter (Signed)
.  Med refill request: Triamcinolone  cream Last AEX 10/30/23 Next AEX: Not scheduled Last MMG (if hormonal med)03/15/23 BI-RADS CATEGORY 1: Negative.  Refill authorized: Please Advise?

## 2024-02-03 ENCOUNTER — Other Ambulatory Visit: Payer: Self-pay

## 2024-02-03 ENCOUNTER — Emergency Department (HOSPITAL_BASED_OUTPATIENT_CLINIC_OR_DEPARTMENT_OTHER)

## 2024-02-03 ENCOUNTER — Emergency Department (HOSPITAL_BASED_OUTPATIENT_CLINIC_OR_DEPARTMENT_OTHER)
Admission: EM | Admit: 2024-02-03 | Discharge: 2024-02-03 | Disposition: A | Discharge status: Home / Self Care | Attending: Emergency Medicine | Admitting: Emergency Medicine

## 2024-02-03 DIAGNOSIS — W01198A Fall on same level from slipping, tripping and stumbling with subsequent striking against other object, initial encounter: Secondary | ICD-10-CM | POA: Insufficient documentation

## 2024-02-03 DIAGNOSIS — Z7982 Long term (current) use of aspirin: Secondary | ICD-10-CM | POA: Insufficient documentation

## 2024-02-03 DIAGNOSIS — S40021A Contusion of right upper arm, initial encounter: Secondary | ICD-10-CM | POA: Diagnosis not present

## 2024-02-03 DIAGNOSIS — S0003XA Contusion of scalp, initial encounter: Secondary | ICD-10-CM | POA: Insufficient documentation

## 2024-02-03 DIAGNOSIS — W19XXXA Unspecified fall, initial encounter: Secondary | ICD-10-CM

## 2024-02-03 DIAGNOSIS — S0990XA Unspecified injury of head, initial encounter: Secondary | ICD-10-CM | POA: Diagnosis present

## 2024-02-03 MED ORDER — ACETAMINOPHEN 500 MG PO TABS
1000.0000 mg | ORAL_TABLET | Freq: Once | ORAL | Status: AC
  Administered 2024-02-03: 1000 mg via ORAL
  Filled 2024-02-03: qty 2

## 2024-02-03 NOTE — ED Triage Notes (Signed)
 Pt POV after mechanical fall, tripped over dishwasher door, fell into wall, hematoma noted to R forehead, no LOC, no thinners. Endorses slight headache and R arm pain, bruising noted.

## 2024-02-03 NOTE — ED Notes (Signed)
 Patient transported to CT

## 2024-02-03 NOTE — ED Provider Notes (Signed)
 [place] EMERGENCY DEPARTMENT AT [facility] Provider Note   CSN: [id number] Arrival date & time: 02/03/24  2004     Patient presents with: [place]   Heidi Tucker is a 83 y.o. female.   HPI Patient where she tripped in her home and fell forward hitting her forehead on the right side against the outside corner of a wall.  She reports she instantly developed a large swelling on the side of her head.  No loss of consciousness no blood thinners.  Patient reports that she also hit her right arm.  She reports is also bruising a lot but she does not have a lot of pain and can move it but she does not think it is broken.  She did not fall to the ground.  No confusion, no neck pain, no weakness numbness or tingling.    Prior to Admission medications   Medication Sig Start Date End Date Taking? Authorizing Provider  aspirin 81 MG tablet Take 81 mg by mouth daily.      [provider]  dapagliflozin  propanediol (FARXIGA ) 10 MG TABS tablet Take 1 tablet (10 mg total) by mouth daily before breakfast. 05/05/23   Caleen Dirks, MD  estradiol  (ESTRACE  VAGINAL) 0.1 MG/GM vaginal cream Place 1 Applicatorful vaginally at bedtime. Rub a dime size amount into the vagina every night for 3 weeks then decrease to 3 times a week after 10/30/23   Glennon Almarie POUR, MD  ezetimibe  (ZETIA ) 10 MG tablet TAKE 1 TABLET BY MOUTH EVERY DAY 04/04/17   Maranda Leim DEL, MD  famotidine (PEPCID) 40 MG tablet Take 40 mg by mouth daily. 03/17/23   [provider]  JUBLIA 10 % SOLN Apply topically. 03/18/23   [provider]  metFORMIN (GLUCOPHAGE-XR) 500 MG 24 hr tablet Take 500 mg by mouth daily. 11/04/22   [provider]  Multiple Vitamins-Minerals (CENTRUM SILVER PO) Take 3 tablets by mouth daily.    [provider]  nystatin -triamcinolone  ointment (MYCOLOG) Apply 1 Application topically 2 (two) times daily. 11/22/22   Lavoie, Marie-Lyne, MD  Omega-3 Fatty Acids (FISH  OIL) 1200 MG CAPS Take 1 capsule by mouth daily.    [provider]  PRIMIDONE PO Take 150 mg by mouth. 08/29/21   [provider]  propranolol (INDERAL LA) 160 MG SR capsule Take 160 mg by mouth daily.  02/16/11   [provider]  rosuvastatin  (CRESTOR ) 10 MG tablet TAKE 1 TABLET BY MOUTH ONCE DAILY 09/16/22   Caleen Dirks, MD  SUMAtriptan (IMITREX) 50 MG tablet Take 50 mg by mouth as needed.  03/13/11   [provider]  traZODone  (DESYREL ) 50 MG tablet Take 1 tablet (50 mg total) by mouth at bedtime. 12/30/22   Amin, Saad, MD  triamcinolone  cream (KENALOG ) 0.1 % APPLY TO AFFECTED AREA EVERY DAY AS NEEDED 01/02/24   Glennon Almarie POUR, MD  VITAMIN D  PO Take 1 tablet by mouth daily.    [provider]    Allergies: Nitrofurantoin monohyd macro, Cephalexin, Doxycycline, and Naproxen    Review of Systems  Updated Vital Signs BP (!) 150/85   Pulse 61   Temp 97.6 F (36.4 C) (Oral)   Resp 14   Ht 5' 3 (1.6 m)   Wt 70.3 kg   SpO2 97%   BMI 27.46 kg/m   Physical Exam Constitutional:      Comments: Alert nontoxic clear mental status no respiratory distress  HENT:     Head:  Comments: Patient has an approximately 6 cm hematoma to the right temple.  No other facial trauma    Right Ear: Tympanic membrane normal.     Left Ear: Tympanic membrane normal.     Nose: Nose normal.     Mouth/Throat:     Pharynx: Oropharynx is clear.  Eyes:     Extraocular Movements: Extraocular movements intact.     Pupils: Pupils are equal, round, and reactive to light.  Neck:     Comments: No midline C-spine tenderness Cardiovascular:     Rate and Rhythm: Normal rate and regular rhythm.  Pulmonary:     Effort: Pulmonary effort is normal.     Breath sounds: Normal breath sounds.  Abdominal:     General: There is no distension.     Palpations: Abdomen is soft.     Tenderness: There is no abdominal tenderness.  Musculoskeletal:     Comments: Bruising  developing to the lower portion of the upper arm.  No abrasion or laceration.  No joint effusion.  Normal range of motion at the elbow shoulder and wrist.  No lower extremity injury.  Skin:    General: Skin is warm and dry.  Neurological:     General: No focal deficit present.     Mental Status: She is oriented to person, place, and time.     Motor: No weakness.     Coordination: Coordination normal.  Psychiatric:        Mood and Affect: Mood normal.     (all labs ordered are listed, but only abnormal results are displayed) Labs Reviewed - No data to display  EKG: None  Radiology: CT Head Wo Contrast Result Date: 02/03/2024 CLINICAL DATA:  Head trauma, minor (Age >= 65y) Pt POV after mechanical fall, tripped over dishwasher door, fell into wall, hematoma noted to R forehead, no LOC, no thinners. Endorses slight headache and R arm pain, bruising noted. EXAM: CT HEAD WITHOUT CONTRAST TECHNIQUE: Contiguous axial images were obtained from the base of the skull through the vertex without intravenous contrast. RADIATION DOSE REDUCTION: This exam was performed according to the departmental dose-optimization program which includes automated exposure control, adjustment of the mA and/or kV according to patient size and/or use of iterative reconstruction technique. COMPARISON:  MRI head 10/07/2009 FINDINGS: Brain: Patchy and confluent areas of decreased attenuation are noted throughout the deep and periventricular white matter of the cerebral hemispheres bilaterally, compatible with chronic microvascular ischemic disease. No evidence of large-territorial acute infarction. No parenchymal hemorrhage. No mass lesion. No extra-axial collection. No mass effect or midline shift. No hydrocephalus. Basilar cisterns are patent. Vascular: No hyperdense vessel. Atherosclerotic calcifications are present within the cavernous internal carotid arteries. Skull: No acute fracture or focal lesion. Sinuses/Orbits:  Paranasal sinuses and mastoid air cells are clear. Bilateral lens replacement. Otherwise the orbits are unremarkable. Other: Right frontal scalp hematoma measuring up to 1 cm. IMPRESSION: No acute intracranial abnormality. Electronically Signed   By: Morgane  Naveau M.D.   On: 02/03/2024 22:17     Procedures   Medications Ordered in the ED  acetaminophen  (TYLENOL ) tablet 1,000 mg (1,000 mg Oral Given 02/03/24 2154)                                    Medical Decision Making Amount and/or Complexity of Data Reviewed Radiology: ordered.  Risk OTC drugs.   Patient presents as outlined for mechanical fall.  She does have large hematoma over the temple on the right.  Will obtain CT head imaging.  Mental status is clear.  Also some bruising above and around the elbow on the right.  However completely normal function without evidence of fracture by clinical exam.  CT head interpreted by radiology no acute findings.  Patient is clinically well in appearance.  She had mechanical fall with a large hematoma to the side of her head on the right.  Instructions given for elevating the arm and sleeping with head of bed at 30 degrees with ice and Tylenol  for pain.  Return precautions reviewed.  Patient encouraged to follow-up with PCP in the next 3 to 5 days.     Final diagnoses:  Fall, initial encounter  Hematoma of scalp, initial encounter  Contusion of right upper extremity, initial encounter    ED Discharge Orders     None          Armenta Canning, MD 02/03/24 2227

## 2024-02-03 NOTE — Discharge Instructions (Signed)
 1.  Take extra strength Tylenol  for pain every 6 hours as needed. 2.  Apply well wrapped ice pack to the area of swelling on your head and on your arm.  Try to elevate your arm is much as possible.  Also try to sleep and rest with the head of your bed or recliner elevated to about 30 degrees.  This will help with swelling. 3.  Schedule follow-up appoint with your doctor in 3 to 5 days for recheck. 4.  Return to the emergency department if you have new or concerning symptoms.  You should anticipate that you will see a lot of purple and blue discoloration of your face as the bruise starts to resolve and you will also see a lot more purple-blue and brown discoloration on your arm.

## 2024-02-09 ENCOUNTER — Other Ambulatory Visit: Payer: Self-pay

## 2024-02-09 ENCOUNTER — Encounter (HOSPITAL_BASED_OUTPATIENT_CLINIC_OR_DEPARTMENT_OTHER): Payer: Self-pay

## 2024-02-09 ENCOUNTER — Emergency Department (HOSPITAL_BASED_OUTPATIENT_CLINIC_OR_DEPARTMENT_OTHER)
Admission: EM | Admit: 2024-02-09 | Discharge: 2024-02-09 | Disposition: A | Discharge status: Home / Self Care | Attending: Emergency Medicine | Admitting: Emergency Medicine

## 2024-02-09 ENCOUNTER — Emergency Department (HOSPITAL_BASED_OUTPATIENT_CLINIC_OR_DEPARTMENT_OTHER): Admitting: Radiology

## 2024-02-09 DIAGNOSIS — W1830XA Fall on same level, unspecified, initial encounter: Secondary | ICD-10-CM | POA: Insufficient documentation

## 2024-02-09 DIAGNOSIS — S2231XA Fracture of one rib, right side, initial encounter for closed fracture: Secondary | ICD-10-CM | POA: Diagnosis not present

## 2024-02-09 DIAGNOSIS — Z79899 Other long term (current) drug therapy: Secondary | ICD-10-CM | POA: Diagnosis not present

## 2024-02-09 DIAGNOSIS — R1011 Right upper quadrant pain: Secondary | ICD-10-CM | POA: Insufficient documentation

## 2024-02-09 DIAGNOSIS — R0789 Other chest pain: Secondary | ICD-10-CM | POA: Diagnosis present

## 2024-02-09 LAB — COMPREHENSIVE METABOLIC PANEL WITH GFR
ALT: 13 U/L (ref 0–44)
AST: 22 U/L (ref 15–41)
Albumin: 4.6 g/dL (ref 3.5–5.0)
Alkaline Phosphatase: 91 U/L (ref 38–126)
Anion gap: 12 (ref 5–15)
BUN: 15 mg/dL (ref 8–23)
CO2: 25 mmol/L (ref 22–32)
Calcium: 9.7 mg/dL (ref 8.9–10.3)
Chloride: 101 mmol/L (ref 98–111)
Creatinine, Ser: 0.72 mg/dL (ref 0.44–1.00)
GFR, Estimated: >60 mL/min (ref >60)
Glucose, Bld: 109 mg/dL — ABNORMAL HIGH (ref 70–99)
Potassium: 4.3 mmol/L (ref 3.5–5.1)
Sodium: 138 mmol/L (ref 135–145)
Total Bilirubin: 0.3 mg/dL (ref 0.0–1.2)
Total Protein: 7.2 g/dL (ref 6.5–8.1)

## 2024-02-09 LAB — CBC WITH DIFFERENTIAL/PLATELET
Abs Immature Granulocytes: 0.02 K/uL (ref 0.00–0.07)
Basophils Absolute: 0 K/uL (ref 0.0–0.1)
Basophils Relative: 0 %
Eosinophils Absolute: 0.3 K/uL (ref 0.0–0.5)
Eosinophils Relative: 4 %
HCT: 44.8 % (ref 36.0–46.0)
Hemoglobin: 15 g/dL (ref 12.0–15.0)
Immature Granulocytes: 0 %
Lymphocytes Relative: 25 %
Lymphs Abs: 1.8 K/uL (ref 0.7–4.0)
MCH: 31.2 pg (ref 26.0–34.0)
MCHC: 33.5 g/dL (ref 30.0–36.0)
MCV: 93.1 fL (ref 80.0–100.0)
Monocytes Absolute: 0.6 K/uL (ref 0.1–1.0)
Monocytes Relative: 9 %
Neutro Abs: 4.4 K/uL (ref 1.7–7.7)
Neutrophils Relative %: 62 %
Platelets: 194 K/uL (ref 150–400)
RBC: 4.81 MIL/uL (ref 3.87–5.11)
RDW: 12.8 % (ref 11.5–15.5)
WBC: 7.2 K/uL (ref 4.0–10.5)
nRBC: 0 % (ref 0.0–0.2)

## 2024-02-09 LAB — LIPASE, BLOOD: Lipase: 30 U/L (ref 11–51)

## 2024-02-09 MED ORDER — LIDOCAINE 5 % EX PTCH
1.0000 | MEDICATED_PATCH | CUTANEOUS | Status: DC
  Administered 2024-02-09: 1 via TRANSDERMAL
  Filled 2024-02-09: qty 1

## 2024-02-09 MED ORDER — LIDOCAINE 5 % EX PTCH: 1.0000 | MEDICATED_PATCH | CUTANEOUS | 0 refills | Status: AC

## 2024-02-09 NOTE — ED Triage Notes (Signed)
 Pt c/o R sided rib pain after fall Tuesday, seen for same but no scan of ribs done

## 2024-02-09 NOTE — Discharge Instructions (Addendum)
 Recommend Tylenol , Motrin, lidocaine  patch for pain control at home in the setting of your rib fractures.  Recommend continued incentive spirometry as well.  This will help exercise your lungs to minimize the chance for development of pneumonia which can happen after rib fracture.

## 2024-02-09 NOTE — ED Provider Notes (Addendum)
 [place] EMERGENCY DEPARTMENT AT [facility] Provider Note   CSN: [id number] Arrival date & time: 02/09/24  1149     Patient presents with: Rib Injury (R)   Heidi Tucker is a 83 y.o. female.   HPI   83 year old female presenting to the emerged part with right-sided chest wall pain.  The patient states that she fell 1 week ago and was seen in the emergency department at that time during which time she had CT imaging which was negative.  The patient had not had rib pain but the following day she developed pain in the right side of her chest.  The pain is sharp, worse with deep inspiration and movement.  She denies vomiting.  Endorses mild nausea.  Prior to Admission medications   Medication Sig Start Date End Date Taking? Authorizing Provider  lidocaine  (LIDODERM ) 5 % Place 1 patch onto the skin daily. Remove & Discard patch within 12 hours or as directed by MD 02/09/24  Yes Jerrol Agent, MD  aspirin 81 MG tablet Take 81 mg by mouth daily.      [provider]  dapagliflozin  propanediol (FARXIGA ) 10 MG TABS tablet Take 1 tablet (10 mg total) by mouth daily before breakfast. 05/05/23   Caleen Dirks, MD  estradiol  (ESTRACE  VAGINAL) 0.1 MG/GM vaginal cream Place 1 Applicatorful vaginally at bedtime. Rub a dime size amount into the vagina every night for 3 weeks then decrease to 3 times a week after 10/30/23   Glennon Almarie POUR, MD  ezetimibe  (ZETIA ) 10 MG tablet TAKE 1 TABLET BY MOUTH EVERY DAY 04/04/17   Maranda Leim DEL, MD  famotidine (PEPCID) 40 MG tablet Take 40 mg by mouth daily. 03/17/23   [provider]  JUBLIA 10 % SOLN Apply topically. 03/18/23   [provider]  metFORMIN (GLUCOPHAGE-XR) 500 MG 24 hr tablet Take 500 mg by mouth daily. 11/04/22   [provider]  Multiple Vitamins-Minerals (CENTRUM SILVER PO) Take 3 tablets by mouth daily.    [provider]  nystatin -triamcinolone  ointment (MYCOLOG) Apply 1 Application  topically 2 (two) times daily. 11/22/22   Lavoie, Marie-Lyne, MD  Omega-3 Fatty Acids (FISH OIL) 1200 MG CAPS Take 1 capsule by mouth daily.    [provider]  PRIMIDONE PO Take 150 mg by mouth. 08/29/21   [provider]  propranolol (INDERAL LA) 160 MG SR capsule Take 160 mg by mouth daily.  02/16/11   [provider]  rosuvastatin  (CRESTOR ) 10 MG tablet TAKE 1 TABLET BY MOUTH ONCE DAILY 09/16/22   Amin, Saad, MD  SUMAtriptan (IMITREX) 50 MG tablet Take 50 mg by mouth as needed.  03/13/11   [provider]  traZODone  (DESYREL ) 50 MG tablet Take 1 tablet (50 mg total) by mouth at bedtime. 12/30/22   Amin, Saad, MD  triamcinolone  cream (KENALOG ) 0.1 % APPLY TO AFFECTED AREA EVERY DAY AS NEEDED 02/04/24   Glennon Almarie POUR, MD  VITAMIN D  PO Take 1 tablet by mouth daily.    [provider]    Allergies: Nitrofurantoin monohyd macro, Cephalexin, Doxycycline, and Naproxen    Review of Systems  All other systems reviewed and are negative.   Updated Vital Signs BP 116/71 (BP Location: Left Arm)   Pulse (!) 59   Temp 98.2 F (36.8 C)   Resp 17   SpO2 96%   Physical Exam Vitals and nursing note reviewed.  Constitutional:      General: She is not in acute distress.  Appearance: She is well-developed.  HENT:     Head: Normocephalic and atraumatic.  Eyes:     Conjunctiva/sclera: Conjunctivae normal.  Cardiovascular:     Rate and Rhythm: Normal rate and regular rhythm.     Heart sounds: No murmur heard. Pulmonary:     Effort: Pulmonary effort is normal. No respiratory distress.     Breath sounds: Normal breath sounds.  Chest:     Comments: Right sided chest wall TTP, no crepitus Abdominal:     Palpations: Abdomen is soft.     Tenderness: There is abdominal tenderness in the right upper quadrant. There is no guarding or rebound.  Musculoskeletal:        General: No swelling.     Cervical back: Neck supple.  Skin:    General: Skin is warm  and dry.     Capillary Refill: Capillary refill takes less than 2 seconds.  Neurological:     Mental Status: She is alert.  Psychiatric:        Mood and Affect: Mood normal.     (all labs ordered are listed, but only abnormal results are displayed) Labs Reviewed  CBC WITH DIFFERENTIAL/PLATELET  COMPREHENSIVE METABOLIC PANEL WITH GFR  LIPASE, BLOOD    EKG: None  Radiology: DG Ribs Unilateral W/Chest Right Result Date: 02/09/2024 EXAM: 3 VIEW(S) XRAY OF THE RIGHT RIBS AND CHEST 02/09/2024 01:53:00 PM COMPARISON: None available. CLINICAL HISTORY: Fall. Pt c/o R sided rib pain after fall Tuesday, seen for same but no scan of ribs done. FINDINGS: BONES: Likely acute nondisplaced right anterolateral seventh rib fracture. LUNGS AND PLEURA: No consolidation or pulmonary edema. No pleural effusion or pneumothorax. HEART AND MEDIASTINUM: No acute abnormality of the cardiac and mediastinal silhouettes. Moderate hiatal hernia. SOFT TISSUES: Metallic skin BB in lower right chest wall at area of symptomatic concern. IMPRESSION: 1. Likely acute nondisplaced right anterolateral seventh rib fracture. No pneumothorax. 2. Moderate hiatal hernia. Electronically signed by: Selinda Blue MD 02/09/2024 02:30 PM EDT RP Workstation: [id number]     Procedures   Medications Ordered in the ED  lidocaine  (LIDODERM ) 5 % 1 patch (1 patch Transdermal Patch Applied 02/09/24 1453)                                    Medical Decision Making Amount and/or Complexity of Data Reviewed Labs: ordered. Radiology: ordered.  Risk Prescription drug management.    83 year old female presenting to the emerged part with right-sided chest wall pain.  The patient states that she fell 1 week ago and was seen in the emergency department at that time during which time she had CT imaging which was negative.  The patient had not had rib pain but the following day she developed pain in the right side of her chest.  The pain is sharp,  worse with deep inspiration and movement.  She denies vomiting.  Endorses mild nausea.  On arrival, the patient was vitally stable.  On exam the patient had mild right upper quadrant tenderness she also had right sided chest wall tenderness.  Concern primarily for rib fracture.  Additionally considered biliary etiology.  Chest x-ray: Right anterior lateral seventh rib fracture without pneumothorax noted.  Labs: CBC without a leukocytosis or anemia, CMP and lipase normal. Low concern for cholecystitis or other biliary etiology given identified rib fracture. The patient was provided with incentive spirometry and advised outpatient pain management.  Final diagnoses:  Closed fracture of one rib of right side, initial encounter    ED Discharge Orders          Ordered    lidocaine  (LIDODERM ) 5 %  Every 24 hours        02/09/24 1515               Jerrol Agent, MD 02/09/24 1515    Jerrol Agent, MD 02/09/24 1526

## 2024-02-09 NOTE — ED Notes (Signed)
 Pt sitting in chair, read and reviewed d/c instructions with pt. Pt verbalized understanding, pt from department with friends.

## 2024-02-13 ENCOUNTER — Ambulatory Visit: Admitting: Obstetrics

## 2024-02-13 NOTE — Progress Notes (Deleted)
 New Patient Evaluation and Consultation  Referring Provider: Glennon Almarie POUR, MD PCP: Dwight Trula SQUIBB, MD Date of Service: 02/13/2024  SUBJECTIVE Chief Complaint: No chief complaint on file.  History of Present Illness: Heidi Tucker is a 83 y.o. {ED SANE MJRZ:80315} female seen in consultation at the request of Dr Glennon for evaluation of pelvic organ prolapse and urinary incontinence.    ***Review of records significant for: ***pulmonary artery dilatation, T2DM, wound infection after surgery  Urinary Symptoms: {urine leakage?:24754} Leaks *** time(s) per {days/wks/mos/yrs:310907}.  Pad use: {NUMBERS 1-10:18281} {pad option:24752} per day.   Patient {ACTION; IS/IS WNU:78978602} bothered by UI symptoms.  Day time voids ***.  Nocturia: *** times per night to void. Voiding dysfunction:  {empties:24755} bladder well.  Patient {DOES NOT does:27190::does not} use a catheter to empty bladder.  When urinating, patient feels {urine symptoms:24756} Drinks: *** per day  UTIs: {NUMBERS 1-10:18281} UTI's in the last year.   {ACTIONS;DENIES/REPORTS:21021675::Denies} history of {urologic concerns:24757} No results found for the last 90 days.   Pelvic Organ Prolapse Symptoms:                  Patient {denies/ admits to:24761} a feeling of a bulge the vaginal area. It has been present for {NUMBER 1-10:22536} {days/wks/mos/yrs:310907}.  Patient {denies/ admits to:24761} seeing a bulge.  This bulge {ACTION; IS/IS WNU:78978602} bothersome.  Bowel Symptom: Bowel movements: *** time(s) per {Time; day/week/month:13537} with constipation Stool consistency: {stool consistency:24758} Straining: {yes/no:19897}.  Splinting: {yes/no:19897}.  Incomplete evacuation: {yes/no:19897}.  Patient {denies/ admits to:24761} accidental bowel leakage / fecal incontinence  Occurs: *** time(s) per {Time; day/week/month:13537}  Consistency with leakage: {stool consistency:24758} Bowel regimen: {bowel  regimen:24759} Last colonoscopy: Date ***, Results *** HM Colonoscopy   This patient has no relevant Health Maintenance data.     Sexual Function Sexually active: {yes/no:19897}.  Sexual orientation: {Sexual [phone]} Pain with sex: {pain with sex:24762}  Pelvic Pain {denies/ admits to:24761} pelvic pain Location: *** Pain occurs: *** Prior pain treatment: *** Improved by: *** Worsened by: ***   Past Medical History:  Past Medical History:  Diagnosis Date   Arthritis    hands   Bilateral swelling of feet    Diabetes mellitus    Generalized headaches    Hearing loss    Hyperlipidemia    Mild CAD    Mild dilation of ascending aorta (HCC)    PVC's (premature ventricular contractions)    Status post deep brain stimulator placement    Wears glasses    Wears hearing aid      Past Surgical History:   Past Surgical History:  Procedure Laterality Date   brain transmitter  2011   help to correct tremor   BRAIN TRANSMITTER  08/2019   brain transmitter     removal for infection 2023   BUNIONECTOMY  2001   CATARACT EXTRACTION, BILATERAL     COSMETIC SURGERY     CYSTECTOMY  1991   back   INNER EAR SURGERY  1972 and 1979   KNEE SURGERY     TUBAL LIGATION  1969     Past OB/GYN History: OB History  Gravida Para Term Preterm AB Living  2 2    2   SAB IAB Ectopic Multiple Live Births          # Outcome Date GA Lbr Len/2nd Weight Sex Type Anes PTL Lv  2 Para           1 Para  Vaginal deliveries: ***,  Forceps/ Vacuum deliveries: ***, Cesarean section: *** Menopausal: {menopausal:24763} Contraception: ***. Last pap smear was ***.  Any history of abnormal pap smears: {yes/no:19897}. No results found for: DIAGPAP, HPVHIGH, ADEQPAP  Medications: Patient has a current medication list which includes the following prescription(s): aspirin, dapagliflozin  propanediol, estradiol , ezetimibe , famotidine, jublia, lidocaine , metformin, multiple  vitamins-minerals, nystatin -triamcinolone  ointment, fish oil, primidone, propranolol er, rosuvastatin , sumatriptan, trazodone , triamcinolone  cream, and vitamin d , and the following Facility-Administered Medications: regadenoson , romosozumab -aqqg, and technetium tetrofosmin .   Allergies: Patient is allergic to nitrofurantoin monohyd macro, cephalexin, doxycycline, and naproxen.   Social History:  Social History   Tobacco Use   Smoking status: Never   Smokeless tobacco: Never  Vaping Use   Vaping status: Never Used  Substance Use Topics   Alcohol use: No   Drug use: No    Relationship status: {relationship status:24764} Patient lives with ***.   Patient {ACTION; IS/IS WNU:78978602} employed ***. Regular exercise: {Yes/No:304960894} History of abuse: {Yes/No:304960894}  Family History:   Family History  Problem Relation Age of Onset   Heart disease Father        heart attack   Diabetes Father    Diabetes Maternal Aunt    Breast cancer Neg Hx      Review of Systems: ROS   OBJECTIVE Physical Exam: There were no vitals filed for this visit.  Physical Exam   GU / Detailed Urogynecologic Evaluation:  Pelvic Exam: Normal external female genitalia; Bartholin's and Skene's glands normal in appearance; urethral meatus normal in appearance, no urethral masses or discharge.   CST: {gen negative/positive:315881}  Reflexes: bulbocavernosis {DESC; PRESENT/NOT PRESENT:21021351}, anocutaneous {DESC; PRESENT/NOT PRESENT:21021351} ***bilaterally.  Speculum exam reveals normal vaginal mucosa {With/Without:20273} atrophy. Cervix {exam; gyn cervix:30847}. Uterus {exam; pelvic uterus:30849}. Adnexa {exam; adnexa:12223}.    s/p hysterectomy: Speculum exam reveals normal vaginal mucosa {With/Without:20273}  atrophy and normal vaginal cuff.  Adnexa {exam; adnexa:12223}.    With apex supported, anterior compartment defect was {reduced:24765}  Pelvic floor strength {Roman # I-V:19040}/V,  puborectalis {Roman # I-V:19040}/V external anal sphincter {Roman # I-V:19040}/V  Pelvic floor musculature: Right levator {Tender/Non-tender:20250}, Right obturator {Tender/Non-tender:20250}, Left levator {Tender/Non-tender:20250}, Left obturator {Tender/Non-tender:20250}  POP-Q:   POP-Q                                               Aa                                               Ba                                                 C                                                Gh  Pb                                               tvl                                                Ap                                               Bp                                                 D      Rectal Exam:  Normal sphincter tone, {rectocele:24766} distal rectocele, enterocoele {DESC; PRESENT/NOT PRESENT:21021351}, no rectal masses, {sign of:24767} dyssynergia when asking the patient to bear down.  Post-Void Residual (PVR) by Bladder Scan: In order to evaluate bladder emptying, we discussed obtaining a postvoid residual and patient agreed to this procedure.  Procedure: The ultrasound unit was placed on the patient's abdomen in the suprapubic region after the patient had voided.      Laboratory Results: No results found for: COLORU, CLARITYU, GLUCOSEUR, BILIRUBINUR, KETONESU, SPECGRAV, RBCUR, PHUR, PROTEINUR, UROBILINOGEN, LEUKOCYTESUR  Lab Results  Component Value Date   CREATININE 0.72 02/09/2024   CREATININE 0.76 10/30/2023   CREATININE 0.86 09/24/2022    Lab Results  Component Value Date   HGBA1C 6.8 (H) 10/30/2023    Lab Results  Component Value Date   HGB 15.0 02/09/2024     ASSESSMENT AND PLAN Ms. Varas is a 83 y.o. with: No diagnosis found.  There are no diagnoses linked to this encounter.   Lianne ONEIDA Gillis, MD

## 2024-03-18 ENCOUNTER — Other Ambulatory Visit: Payer: Self-pay | Admitting: Obstetrics and Gynecology

## 2024-03-18 ENCOUNTER — Other Ambulatory Visit: Payer: Self-pay | Admitting: Internal Medicine

## 2024-03-18 NOTE — Telephone Encounter (Signed)
 Med refill request: triamcinolone  cream  Last AEX: 10/30/23 Next AEX: not scheduled  Last MMG (if hormonal med) Refill authorized: last rx 02/04/24 #30g with 0 refills. Please approve or deny

## 2024-04-20 ENCOUNTER — Ambulatory Visit: Admitting: Obstetrics

## 2024-05-24 ENCOUNTER — Other Ambulatory Visit: Payer: Self-pay | Admitting: Obstetrics and Gynecology

## 2024-05-24 DIAGNOSIS — N9089 Other specified noninflammatory disorders of vulva and perineum: Secondary | ICD-10-CM

## 2024-05-25 NOTE — Telephone Encounter (Signed)
 Med refill request: triamcinolone  cream 0.1 % Last AEX: 10/30/23 Next AEX: none scheduled Last MMG (if hormonal med) n/a  Dx: vulvar irritation Last refill: 03/18/2024 Refill sent to provider for approval or denial.

## 2024-06-30 ENCOUNTER — Ambulatory Visit: Admitting: Obstetrics and Gynecology

## 2024-06-30 ENCOUNTER — Ambulatory Visit: Payer: Self-pay | Admitting: Obstetrics and Gynecology

## 2024-06-30 ENCOUNTER — Encounter: Payer: Self-pay | Admitting: Obstetrics and Gynecology

## 2024-06-30 VITALS — BP 110/74 | HR 65

## 2024-06-30 DIAGNOSIS — L280 Lichen simplex chronicus: Secondary | ICD-10-CM

## 2024-06-30 DIAGNOSIS — B379 Candidiasis, unspecified: Secondary | ICD-10-CM | POA: Diagnosis not present

## 2024-06-30 DIAGNOSIS — N898 Other specified noninflammatory disorders of vagina: Secondary | ICD-10-CM

## 2024-06-30 LAB — WET PREP FOR TRICH, YEAST, CLUE

## 2024-06-30 MED ORDER — ESTRADIOL 0.01 % VA CREA: 1.0000 | TOPICAL_CREAM | Freq: Every day | VAGINAL | 12 refills | Status: AC

## 2024-06-30 MED ORDER — CLOBETASOL PROPIONATE 0.05 % EX OINT: 1.0000 | TOPICAL_OINTMENT | CUTANEOUS | 5 refills | Status: AC

## 2024-06-30 MED ORDER — FLUCONAZOLE 150 MG PO TABS: 150.0000 mg | ORAL_TABLET | Freq: Once | ORAL | 0 refills | Status: AC

## 2024-06-30 MED ORDER — ALOE VESTA 2-N-1 PROTECTIVE EX OINT: TOPICAL_OINTMENT | Freq: Two times a day (BID) | CUTANEOUS | 0 refills | Status: AC | PRN

## 2024-06-30 MED ORDER — METRONIDAZOLE 500 MG PO TABS: 500.0000 mg | ORAL_TABLET | Freq: Two times a day (BID) | ORAL | 0 refills | Status: AC

## 2024-06-30 NOTE — Progress Notes (Signed)
" ° °  Acute Office Visit  Subjective:    Patient ID: Heidi Tucker, female    DOB: September 03, 1940, 84 y.o.   MRN: [id number]   HPI 84 y.o. presents today for Vaginitis (Pt c/o vaginal irritation & itching//jj) . Patient states she ran out of triamcinolone  and is having very bothersome vaginal itching  No LMP recorded. Patient is postmenopausal.    Review of Systems     Objective:    OBGyn Exam  BP 110/74   Pulse 65   SpO2 99%  Wt Readings from Last 3 Encounters:  02/03/24 155 lb (70.3 kg)  01/07/24 160 lb (72.6 kg)  10/30/23 159 lb (72.1 kg)        [name], CMA was present for the exam Excoriations seen on both labial down past introitus SVE: possible yeast infection  Assessment & Plan:  Vaginal pruritus Vulvar LS appearance  Nuswab sent with BV and yeast. Explained and counseled on the treatment medication. Discussed avoiding any alcohol while taking. Begin course of clobetasol  and continue daily vaginal and external estrogen cream Referral placed to dermatology as well. May need punch biopsy with persistent or worsening s/s  Heidi Tucker "

## 2024-07-01 ENCOUNTER — Ambulatory Visit: Admitting: Podiatry

## 2024-07-22 ENCOUNTER — Ambulatory Visit: Admitting: Podiatry

## 2024-10-12 ENCOUNTER — Ambulatory Visit: Admitting: Dermatology
